# Patient Record
Sex: Male | Born: 1955 | Race: Black or African American | Hispanic: No | Marital: Married | Smoking: Former smoker
Health system: Southern US, Community
[De-identification: ages and names within clinical notes are randomized; demographics above are authoritative.]

## PROBLEM LIST (undated history)

## (undated) DIAGNOSIS — I10 Essential (primary) hypertension: Secondary | ICD-10-CM

## (undated) DIAGNOSIS — R079 Chest pain, unspecified: Secondary | ICD-10-CM

## (undated) DIAGNOSIS — M1990 Unspecified osteoarthritis, unspecified site: Secondary | ICD-10-CM

## (undated) DIAGNOSIS — Z860100 Personal history of colon polyps, unspecified: Secondary | ICD-10-CM

## (undated) DIAGNOSIS — K5909 Other constipation: Secondary | ICD-10-CM

## (undated) DIAGNOSIS — K219 Gastro-esophageal reflux disease without esophagitis: Secondary | ICD-10-CM

## (undated) DIAGNOSIS — E785 Hyperlipidemia, unspecified: Secondary | ICD-10-CM

## (undated) DIAGNOSIS — K279 Peptic ulcer, site unspecified, unspecified as acute or chronic, without hemorrhage or perforation: Secondary | ICD-10-CM

## (undated) DIAGNOSIS — F17200 Nicotine dependence, unspecified, uncomplicated: Secondary | ICD-10-CM

## (undated) DIAGNOSIS — Z6835 Body mass index (BMI) 35.0-35.9, adult: Secondary | ICD-10-CM

## (undated) DIAGNOSIS — M109 Gout, unspecified: Secondary | ICD-10-CM

## (undated) HISTORY — DX: Chest pain, unspecified: R07.9

## (undated) HISTORY — DX: Body mass index (BMI) 35.0-35.9, adult: Z68.35

## (undated) HISTORY — PX: UMBILICAL HERNIA REPAIR: SHX196

## (undated) HISTORY — DX: Essential (primary) hypertension: I10

## (undated) HISTORY — DX: Personal history of colonic polyps: Z86.010

## (undated) HISTORY — DX: Gastro-esophageal reflux disease without esophagitis: K21.9

## (undated) HISTORY — DX: Hyperlipidemia, unspecified: E78.5

## (undated) HISTORY — PX: CERVICAL SPINE SURGERY: SHX589

## (undated) HISTORY — DX: Unspecified osteoarthritis, unspecified site: M19.90

## (undated) HISTORY — PX: ELBOW SURGERY: SHX618

## (undated) HISTORY — DX: Personal history of colon polyps, unspecified: Z86.0100

## (undated) HISTORY — DX: Other constipation: K59.09

## (undated) HISTORY — DX: Nicotine dependence, unspecified, uncomplicated: F17.200

## (undated) HISTORY — DX: Peptic ulcer, site unspecified, unspecified as acute or chronic, without hemorrhage or perforation: K27.9

---

## 2004-12-17 HISTORY — PX: CARDIAC CATHETERIZATION: SHX172

## 2006-02-23 HISTORY — PX: ESOPHAGOGASTRODUODENOSCOPY: SHX1529

## 2011-09-22 DIAGNOSIS — M19029 Primary osteoarthritis, unspecified elbow: Secondary | ICD-10-CM | POA: Insufficient documentation

## 2011-10-06 DIAGNOSIS — I10 Essential (primary) hypertension: Secondary | ICD-10-CM | POA: Insufficient documentation

## 2011-10-06 DIAGNOSIS — J449 Chronic obstructive pulmonary disease, unspecified: Secondary | ICD-10-CM | POA: Insufficient documentation

## 2011-10-06 DIAGNOSIS — Z720 Tobacco use: Secondary | ICD-10-CM | POA: Insufficient documentation

## 2011-10-06 DIAGNOSIS — Z8719 Personal history of other diseases of the digestive system: Secondary | ICD-10-CM | POA: Insufficient documentation

## 2011-10-11 DIAGNOSIS — M24529 Contracture, unspecified elbow: Secondary | ICD-10-CM | POA: Insufficient documentation

## 2013-05-08 HISTORY — PX: COLONOSCOPY: SHX174

## 2014-02-12 ENCOUNTER — Telehealth: Payer: Self-pay | Admitting: *Deleted

## 2014-02-12 NOTE — Telephone Encounter (Signed)
Pt asked for his appt time on 02/13/2014. I informed him the appt is at 0900am with Dr Charlsie Merlesegal.

## 2014-02-13 ENCOUNTER — Encounter: Payer: Self-pay | Admitting: Podiatry

## 2014-02-13 ENCOUNTER — Ambulatory Visit (INDEPENDENT_AMBULATORY_CARE_PROVIDER_SITE_OTHER): Payer: BC Managed Care – PPO | Admitting: Podiatry

## 2014-02-13 ENCOUNTER — Ambulatory Visit (INDEPENDENT_AMBULATORY_CARE_PROVIDER_SITE_OTHER): Payer: BC Managed Care – PPO

## 2014-02-13 VITALS — BP 131/87 | HR 69 | Resp 16

## 2014-02-13 DIAGNOSIS — L600 Ingrowing nail: Secondary | ICD-10-CM

## 2014-02-13 DIAGNOSIS — R52 Pain, unspecified: Secondary | ICD-10-CM

## 2014-02-13 DIAGNOSIS — M2040 Other hammer toe(s) (acquired), unspecified foot: Secondary | ICD-10-CM

## 2014-02-13 DIAGNOSIS — M898X9 Other specified disorders of bone, unspecified site: Secondary | ICD-10-CM

## 2014-02-14 NOTE — Progress Notes (Signed)
Subjective:     Patient ID: Michael Whitaker, male   DOB: 05-08-1956, 58 y.o.   MRN: [id number]  HPI patient presents stating I am having awful problems with my fifth toe like I had before and my big toenail on my left foot has become increasingly thick and painful when pressed and I cannot wear shoes anymore. Admits he should affix this fifth toe last year when we talked about it but he did not have time and he now is desperate for correction   Review of Systems     Objective:   Physical Exam Neurovascular status found to be intact with patient well oriented and found to have good range of motion subtalar and midtarsal joint with good Fill time to the lesser digits with the digits being warm and well perfused. Distal lateral fifth toe left has a very painful keratotic lesion with rotation of the fifth toe noted causing the increased pressure against this area. Thick hallux nail left that is very painful when pressed from a dorsal direction    Assessment:     Chronic foot structural issues with deviation of the fifth toe leading to keratotic lesion formation and pain and nail disease with thickness and discomfort first left    Plan:     H&P and x-ray reviewed. Discussed condition at great length and discussed fixing this versus trimming which was not effective for him. He wants this fixed and would like to get it done as soon as possible due to pain and at this time I allowed him to read a consent form for distal rotation arthroplasty fifth left distal lateral exostectomy fifth left and removal of the hallux nail. I explained all possible complications as listed and the fact that total recovery. We'll take 6 months to one year and he will not be able to return to his traditional steel toe shoe for a minimum of 4 weeks and up to 8 weeks or longer period patient wants surgery and is given all instructions and is scheduled next week for outpatient surgery and is encouraged to call with any other  questions

## 2014-02-18 ENCOUNTER — Encounter: Payer: Self-pay | Admitting: Podiatry

## 2014-02-18 DIAGNOSIS — L600 Ingrowing nail: Secondary | ICD-10-CM

## 2014-02-18 DIAGNOSIS — M898X9 Other specified disorders of bone, unspecified site: Secondary | ICD-10-CM

## 2014-02-18 DIAGNOSIS — M2040 Other hammer toe(s) (acquired), unspecified foot: Secondary | ICD-10-CM

## 2014-02-19 ENCOUNTER — Telehealth: Payer: Self-pay | Admitting: *Deleted

## 2014-02-19 NOTE — Telephone Encounter (Signed)
Per Dr. Charlsie Merlesegal, I called and advised the patient to take the bandage off the toe that he had the toenail procedure.  I told him to gently cleanse the toe with some antibacterial soap on a wash cloth, do not rub, pat.  Then redress with a bandaid and some neosporin or triple antibiotic ointment.  Repeat this daily until return visit.

## 2014-02-21 ENCOUNTER — Telehealth: Payer: Self-pay | Admitting: *Deleted

## 2014-02-21 NOTE — Telephone Encounter (Signed)
Pt states his ankle is swollen on his surgery foot and asked if it was going to stay that way.  I told pt he may have been up on it too much, to be on that surgery foot no more than 5 minutes per hour.  I instructed pt to take off the ace only and elevate the surgery foot 15 minutes then rewrap ace looser.  Pt denies any other concerns.

## 2014-02-24 ENCOUNTER — Ambulatory Visit (INDEPENDENT_AMBULATORY_CARE_PROVIDER_SITE_OTHER): Payer: BC Managed Care – PPO | Admitting: Podiatry

## 2014-02-24 ENCOUNTER — Ambulatory Visit (INDEPENDENT_AMBULATORY_CARE_PROVIDER_SITE_OTHER): Payer: BC Managed Care – PPO

## 2014-02-24 ENCOUNTER — Encounter: Payer: Self-pay | Admitting: Podiatry

## 2014-02-24 VITALS — BP 147/103 | HR 78 | Resp 16

## 2014-02-24 DIAGNOSIS — Z9889 Other specified postprocedural states: Secondary | ICD-10-CM

## 2014-02-24 DIAGNOSIS — B351 Tinea unguium: Secondary | ICD-10-CM

## 2014-02-24 DIAGNOSIS — M2040 Other hammer toe(s) (acquired), unspecified foot: Secondary | ICD-10-CM

## 2014-02-25 NOTE — Progress Notes (Signed)
Subjective:     Patient ID: Michael Whitaker, male   DOB: 09-13-1956, 58 y.o.   MRN: [id number]  HPI patient presents a my foot is feeling fine states that he is walking well with mild discomfort if she's on it too much   Review of Systems     Objective:   Physical Exam Neurovascular status intact with patient well oriented x3 and well healed surgical sites fifth digit left and distal lateral side fifth toe. Nail site were nail was removed is doing well    Assessment:     Patient is responding well and appears to be healing well 6 days after surgery left    Plan:     Reapplied sterile dressing and instructed on continued open toed shoes and reappoint 2 weeks for suture removal earlier if necessary

## 2014-03-10 ENCOUNTER — Encounter: Payer: Self-pay | Admitting: Podiatry

## 2014-03-10 ENCOUNTER — Other Ambulatory Visit: Payer: BC Managed Care – PPO

## 2014-03-10 ENCOUNTER — Ambulatory Visit (INDEPENDENT_AMBULATORY_CARE_PROVIDER_SITE_OTHER): Payer: BC Managed Care – PPO | Admitting: Podiatry

## 2014-03-10 VITALS — BP 147/94 | HR 77 | Resp 16 | Ht 73.0 in | Wt 250.0 lb

## 2014-03-10 DIAGNOSIS — M898X9 Other specified disorders of bone, unspecified site: Secondary | ICD-10-CM

## 2014-03-10 DIAGNOSIS — M2040 Other hammer toe(s) (acquired), unspecified foot: Secondary | ICD-10-CM

## 2014-03-10 NOTE — Progress Notes (Signed)
Subjective:     Patient ID: Michael Whitaker, male   DOB: 11/21/1956, 58 y.o.   MRN: [id number]  HPI patient presents for suture removal left fifth toe stating he's doing well and his nail is healing well   Review of Systems     Objective:   Physical Exam Neurovascular status intact no health history changes noted with well-healing surgical sites fifth and fourth toe left foot and nail left hallux that is crusting over and looks good    Assessment:     Well-healing digital deformities with stitches intact and wound edges coapted well and well-healing nail site left big toe    Plan:     Stitches are removed and advised on gradual return to saw shoe gear in the next week and hopeful return to work in 1 week. Patient will be seen back 4 weeks earlier if necessary

## 2014-03-10 NOTE — Progress Notes (Signed)
Pt presents for POV2 and suture removal.  Pt's left 5th toe and left 1st toe are without redness, swelling or discharge.  I removed the sutures and instructed pt not to shower for 24 to 48 hours after there removal.

## 2014-03-12 NOTE — Progress Notes (Signed)
Dr Charlsie Merlesegal performed - arthroplasty left 5th distal toe                                  - exostectomy left lateral 5th distal toe                                  - removal left big toenail permanently

## 2014-03-14 NOTE — Progress Notes (Signed)
Dr Charlsie Merlesegal ordered - Demerol 50mg  #30 1 or 2 tablets every 4 to 6 hours prn pain, and Phenergan 25mg  #30 1 or 2 tablets with Demerol.

## 2014-04-07 ENCOUNTER — Ambulatory Visit (INDEPENDENT_AMBULATORY_CARE_PROVIDER_SITE_OTHER): Payer: Self-pay | Admitting: Podiatry

## 2014-04-07 ENCOUNTER — Ambulatory Visit (INDEPENDENT_AMBULATORY_CARE_PROVIDER_SITE_OTHER): Payer: BC Managed Care – PPO

## 2014-04-07 ENCOUNTER — Encounter: Payer: Self-pay | Admitting: Podiatry

## 2014-04-07 VITALS — BP 171/92 | HR 77 | Resp 16

## 2014-04-07 DIAGNOSIS — M2040 Other hammer toe(s) (acquired), unspecified foot: Secondary | ICD-10-CM

## 2014-04-07 NOTE — Progress Notes (Signed)
Subjective:     Patient ID: Michael Whitaker, male   DOB: 02-01-1956, 58 y.o.   MRN: [id number]  HPI patient presents stating my toes are doing well and I am now back to working and wearing steel toe shoes   Review of Systems     Objective:   Physical Exam Neurovascular status found to be intact with digits on the left foot healing well with mild edema no erythema or drainage noted and wound edges well coapted    Assessment:     Healing well from foot surgery left    Plan:     Debris the small lesion and allow patient to return to normal activity and reviewed final x-rays. Reappoint as needed

## 2015-01-19 ENCOUNTER — Encounter (HOSPITAL_COMMUNITY): Payer: Self-pay

## 2015-01-19 ENCOUNTER — Ambulatory Visit: Payer: Self-pay | Admitting: Family Medicine

## 2015-01-19 ENCOUNTER — Emergency Department (HOSPITAL_COMMUNITY)
Admission: EM | Admit: 2015-01-19 | Discharge: 2015-01-19 | Disposition: A | Discharge status: Home / Self Care | Payer: BLUE CROSS/BLUE SHIELD | Attending: Emergency Medicine | Admitting: Emergency Medicine

## 2015-01-19 DIAGNOSIS — Y998 Other external cause status: Secondary | ICD-10-CM | POA: Diagnosis not present

## 2015-01-19 DIAGNOSIS — I10 Essential (primary) hypertension: Secondary | ICD-10-CM | POA: Insufficient documentation

## 2015-01-19 DIAGNOSIS — Z8719 Personal history of other diseases of the digestive system: Secondary | ICD-10-CM | POA: Diagnosis not present

## 2015-01-19 DIAGNOSIS — W228XXA Striking against or struck by other objects, initial encounter: Secondary | ICD-10-CM | POA: Insufficient documentation

## 2015-01-19 DIAGNOSIS — S4992XA Unspecified injury of left shoulder and upper arm, initial encounter: Secondary | ICD-10-CM | POA: Diagnosis not present

## 2015-01-19 DIAGNOSIS — R52 Pain, unspecified: Secondary | ICD-10-CM

## 2015-01-19 DIAGNOSIS — Y9389 Activity, other specified: Secondary | ICD-10-CM | POA: Diagnosis not present

## 2015-01-19 DIAGNOSIS — Z79899 Other long term (current) drug therapy: Secondary | ICD-10-CM | POA: Diagnosis not present

## 2015-01-19 DIAGNOSIS — Y9289 Other specified places as the place of occurrence of the external cause: Secondary | ICD-10-CM | POA: Insufficient documentation

## 2015-01-19 DIAGNOSIS — S4991XA Unspecified injury of right shoulder and upper arm, initial encounter: Secondary | ICD-10-CM | POA: Diagnosis not present

## 2015-01-19 DIAGNOSIS — Z720 Tobacco use: Secondary | ICD-10-CM | POA: Diagnosis not present

## 2015-01-19 DIAGNOSIS — S6992XA Unspecified injury of left wrist, hand and finger(s), initial encounter: Secondary | ICD-10-CM | POA: Diagnosis present

## 2015-01-19 MED ORDER — METOPROLOL SUCCINATE ER 100 MG PO TB24: 100.0000 mg | ORAL_TABLET | Freq: Every day | ORAL | Status: AC

## 2015-01-19 MED ORDER — KETOROLAC TROMETHAMINE 30 MG/ML IJ SOLN
30.0000 mg | Freq: Once | INTRAMUSCULAR | Status: AC
  Administered 2015-01-19: 30 mg via INTRAMUSCULAR
  Filled 2015-01-19: qty 1

## 2015-01-19 MED ORDER — TRAMADOL HCL 50 MG PO TABS: 50.0000 mg | ORAL_TABLET | Freq: Four times a day (QID) | ORAL | Status: DC | PRN

## 2015-01-19 MED ORDER — IBUPROFEN 600 MG PO TABS: 600.0000 mg | ORAL_TABLET | Freq: Three times a day (TID) | ORAL | Status: AC

## 2015-01-19 NOTE — ED Provider Notes (Signed)
CSN: [id number]     Arrival date & time 01/19/15  1445 History   First MD Initiated Contact with Patient 01/19/15 1519     Chief Complaint  Patient presents with  . Hand Pain  . Shoulder Pain     (Consider location/radiation/quality/duration/timing/severity/associated sxs/prior Treatment) HPI Patient presents with concern of pain in 3 areas. History concern is pain in his left hand.  Pain has been present for a long time, approximately 5 months, following an injury when he struck a hard object while tightening a bolt. Since that time there has been pain focally in the ulnar aspect of the dorsal left hand with difficulty fully extending his third fourth and fifth digits. Patient has no change in sensation, nor strength. Pain is sore. Pain is improved with OTC medication. Today the patient was scheduled to see a hand specialist.  Appointment was canceled due to weather concerns.  Patient also complains of bilateral shoulder pain.  Pain is symmetric in severity and location, focally in the medial superior aspect of each shoulder, worse with shoulder abduction, pressure. There is no other anterior chest pain, no dyspnea, no lightheadedness, no nausea. Patient is not exertional or pleuritic.  Past Medical History  Diagnosis Date  . Hypertension   . GERD (gastroesophageal reflux disease)    Past Surgical History  Procedure Laterality Date  . Elbow surgery     History reviewed. No pertinent family history. History  Substance Use Topics  . Smoking status: Current Every Day Smoker -- 1.50 packs/day    Types: Cigarettes  . Smokeless tobacco: Not on file  . Alcohol Use: Yes     Comment: weekends mostly    Occupation: Patient works in a [employer]    Review of Systems  Constitutional:       Per HPI, otherwise negative  HENT:       Per HPI, otherwise negative  Respiratory:       Per HPI, otherwise negative  Cardiovascular:       Per HPI, otherwise  negative  Gastrointestinal: Negative for vomiting.  Endocrine:       Negative aside from HPI  Genitourinary:       Neg aside from HPI   Musculoskeletal:       Per HPI, otherwise negative  Skin: Negative for color change and pallor.  Neurological: Negative for syncope.      Allergies  Review of patient's allergies indicates no known allergies.  Home Medications   Prior to Admission medications   Medication Sig Start Date End Date Taking? Authorizing Provider  ibuprofen (ADVIL,MOTRIN) 200 MG tablet Take 600 mg by mouth every 6 (six) hours as needed for mild pain.   Yes Historical Provider, MD  sildenafil (VIAGRA) 50 MG tablet Take 50 mg by mouth daily as needed for erectile dysfunction.   Yes Historical Provider, MD  metoprolol succinate (TOPROL-XL) 100 MG 24 hr tablet Take 100 mg by mouth daily. Take with or immediately following a meal.    Historical Provider, MD   BP 155/87 mmHg  Pulse 85  Temp(Src) 98.2 F (36.8 C) (Oral)  Resp 18  Ht  (1.854 m)  Wt 250 lb (113.399 kg)  BMI 32.99 kg/m2  SpO2 98% Physical Exam  Constitutional: He is oriented to person, place, and time. He appears well-developed. No distress.  HENT:  Head: Normocephalic and atraumatic.  Eyes: Conjunctivae and EOM are normal.  Cardiovascular: Normal rate and regular rhythm.   Pulmonary/Chest: Effort normal.  No stridor. No respiratory distress.  Abdominal: He exhibits no distension.  Musculoskeletal: He exhibits no edema.       Right shoulder: He exhibits tenderness and pain. He exhibits normal range of motion, no bony tenderness, no swelling, no effusion, no crepitus, no deformity, no laceration, no spasm, normal pulse and normal strength.       Left shoulder: He exhibits tenderness and pain. He exhibits normal range of motion, no bony tenderness, no swelling, no effusion, no crepitus, no deformity, no laceration, no spasm, normal pulse and normal strength.       Arms: Neurological: He is alert and  oriented to person, place, and time. He displays no atrophy and no tremor. No cranial nerve deficit or sensory deficit. He exhibits normal muscle tone. He displays no seizure activity.  Skin: Skin is warm and dry.  Psychiatric: He has a normal mood and affect.  Nursing note and vitals reviewed.   ED Course  Procedures (including critical care time) Cardiac monitor has sinus rhythm, 85, no notable changes. Pulse ox between 99% room air normal Patient smokes, and I discussed smoking cessation with him, encouraged him to pursue this. Patient requested refill of his antihypertensives.   MDM  Patient presents with pain in multiple areas.  The duration of pain, as well as his denial of any chest pain, dyspnea, lightheadedness is reassuring for the low suspicion of ongoing coronary ischemia with atypical presentation Similarly, there is no evidence for infection, neurovascular compromise. Patient was started on a course of anti-inflammatories, analgesics, cryotherapy. Patient has follow-up with hand surgery next week, will follow up with orthopedics as well for evaluation of your shoulder pain, consideration of physical therapy.   Michael Munchobert Malva Diesing, MD 01/19/15 1556

## 2015-01-19 NOTE — Discharge Instructions (Signed)
As discussed, your evaluation today has been largely reassuring.  But, it is important that you monitor your condition carefully, and do not hesitate to return to the ED if you develop new, or concerning changes in your condition.  It is very important that you follow-up as scheduled with your hand specialist, and with an orthopedic surgeon for further evaluation of your shoulder and hand pain.  Please be sure to take all medication as directed, and use ice packs at least twice daily for the next week.

## 2015-01-19 NOTE — ED Notes (Signed)
Pt here for upper shoulder and hand pain, worse with movement. Unknown if injury

## 2015-01-27 ENCOUNTER — Ambulatory Visit (INDEPENDENT_AMBULATORY_CARE_PROVIDER_SITE_OTHER): Payer: BLUE CROSS/BLUE SHIELD | Admitting: Family Medicine

## 2015-01-27 ENCOUNTER — Encounter: Payer: Self-pay | Admitting: Family Medicine

## 2015-01-27 ENCOUNTER — Other Ambulatory Visit (INDEPENDENT_AMBULATORY_CARE_PROVIDER_SITE_OTHER): Payer: BLUE CROSS/BLUE SHIELD

## 2015-01-27 VITALS — BP 132/82 | HR 60 | Ht 73.0 in | Wt 252.0 lb

## 2015-01-27 DIAGNOSIS — M79642 Pain in left hand: Secondary | ICD-10-CM

## 2015-01-27 DIAGNOSIS — M6530 Trigger finger, unspecified finger: Secondary | ICD-10-CM | POA: Insufficient documentation

## 2015-01-27 DIAGNOSIS — S62307A Unspecified fracture of fifth metacarpal bone, left hand, initial encounter for closed fracture: Secondary | ICD-10-CM

## 2015-01-27 NOTE — Patient Instructions (Signed)
Good to see you Ice 20 minutes 2 times daily. Usually after activity and before bed. Exercises 3 times a week.  Vitamin D 2000 IU daily Turmeric 500mg  twice daily Try pennsaid twice daily See me again in 3 weeks.

## 2015-01-27 NOTE — Assessment & Plan Note (Signed)
Patient does have some angulation of the fifth metacarpal. I believe the patient does have a fracture. We discussed possibly getting x-rays which patient declined today. Patient does have some bony abnormality on ultrasound today that does correspond with this. Patient was given the choice of a possible ulnar gutter splint which patient also declined with this being 5 months out. Discussed vitamin D supplementation to try to see if we can improve his healing. Patient and will come back and see me again in 3-4 weeks to make sure the patient is improving.

## 2015-01-27 NOTE — Progress Notes (Signed)
  Michael ScaleZach Whitaker D.O. [facility] [address] [place], [place] [address] Phone: [phone] Subjective:      CC: Left hand pain  QQV:[id number]:Subjective Michael Whitaker is a 59 y.o. male coming in with complaint of left hand pain. Patient has had this pain for a long time. States that his been approximately 5 months. Patient states that he did hit his hand against something well he was working. Had significant amount of pain. Patient states since then he has been having trouble with his ring and no finger seems to get locked in a flexed position. Patient states that sometimes he has to lift him with his other hand. In addition to this patient also states that he has severe pain on the outside of his fifth finger when sometimes it hurts. Patient denies any numbness.     Past medical history, social, surgical and family history all reviewed in electronic medical record.   Review of Systems: No headache, visual changes, nausea, vomiting, diarrhea, constipation, dizziness, abdominal pain, skin rash, fevers, chills, night sweats, weight loss, swollen lymph nodes, body aches, joint swelling, muscle aches, chest pain, shortness of breath, mood changes.   Objective Blood pressure 132/82, pulse 60, height 6\' 1"  (1.854 m), weight 252 lb (114.306 kg), SpO2 98 %.  General: No apparent distress alert and oriented x3 mood and affect normal, dressed appropriately.  HEENT: Pupils equal, extraocular movements intact  Respiratory: Patient's speak in full sentences and does not appear short of breath  Cardiovascular: No lower extremity edema, non tender, no erythema  Skin: Warm dry intact with no signs of infection or rash on extremities or on axial skeleton.  Abdomen: Soft nontender  Neuro: Cranial nerves II through XII are intact, neurovascularly intact in all extremities with 2+ DTRs and 2+ pulses.  Lymph: No lymphadenopathy of posterior or anterior cervical chain or axillae bilaterally.  Gait normal  with good balance and coordination.  MSK:  Non tender with full range of motion and good stability and symmetric strength and tone of shoulders, elbows, wrist, hip, knee and ankles bilaterally.  Hand exam of the left hand shows the patient does have a cannulation of the fifth metacarpal. He is approximately 20 of angulation. Patient also cannot close it completely. Patient is very tender just proximal to the [facility] joint.  Addition this patient does have trigger nodules of the A2 pulleys of both the ring and middle finger.  After verbal consent patient was prepped with call swabs and with a 25-gauge 1 inch needle patient was injected with ultrasound guidance with 0.5 mL of 0.5% Marcaine and 0.5 mL of 40 mg/dL Kenalog with separate injections in the to trigger figures. Patient tolerated the procedures very well. Picture saved. Post injection instructions given.   Impression and Recommendations:     This case required medical decision making of moderate complexity.

## 2015-01-27 NOTE — Assessment & Plan Note (Signed)
Patient was given injections today. Patient was given home exercises and did work with formal athletic therapist. We discussed icing, and topical antibiotic was, and home exercises in great detail. Patient will come back and see me again in 3 weeks to make sure the patient is improving.

## 2015-01-27 NOTE — Progress Notes (Signed)
Pre visit review using our clinic review tool, if applicable. No additional management support is needed unless otherwise documented below in the visit note. 

## 2015-02-17 ENCOUNTER — Ambulatory Visit: Payer: BLUE CROSS/BLUE SHIELD | Admitting: Family Medicine

## 2015-02-21 ENCOUNTER — Emergency Department (HOSPITAL_COMMUNITY)
Admission: EM | Admit: 2015-02-21 | Discharge: 2015-02-21 | Disposition: A | Discharge status: Home / Self Care | Payer: BLUE CROSS/BLUE SHIELD | Attending: Emergency Medicine | Admitting: Emergency Medicine

## 2015-02-21 ENCOUNTER — Emergency Department (HOSPITAL_COMMUNITY): Payer: BLUE CROSS/BLUE SHIELD

## 2015-02-21 ENCOUNTER — Encounter (HOSPITAL_COMMUNITY): Payer: Self-pay

## 2015-02-21 DIAGNOSIS — Z8719 Personal history of other diseases of the digestive system: Secondary | ICD-10-CM | POA: Diagnosis not present

## 2015-02-21 DIAGNOSIS — I10 Essential (primary) hypertension: Secondary | ICD-10-CM

## 2015-02-21 DIAGNOSIS — Z720 Tobacco use: Secondary | ICD-10-CM | POA: Diagnosis not present

## 2015-02-21 DIAGNOSIS — Z79899 Other long term (current) drug therapy: Secondary | ICD-10-CM | POA: Insufficient documentation

## 2015-02-21 DIAGNOSIS — R002 Palpitations: Secondary | ICD-10-CM

## 2015-02-21 LAB — CBC
HCT: 44.1 % (ref 39.0–52.0)
Hemoglobin: 14.4 g/dL (ref 13.0–17.0)
MCH: 31.9 pg (ref 26.0–34.0)
MCHC: 32.7 g/dL (ref 30.0–36.0)
MCV: 97.6 fL (ref 78.0–100.0)
Platelets: 216 10*3/uL (ref 150–400)
RBC: 4.52 MIL/uL (ref 4.22–5.81)
RDW: 13.3 % (ref 11.5–15.5)
WBC: 8 10*3/uL (ref 4.0–10.5)

## 2015-02-21 LAB — BASIC METABOLIC PANEL WITH GFR
Anion gap: 6 (ref 5–15)
BUN: 6 mg/dL (ref 6–23)
CO2: 26 mmol/L (ref 19–32)
Calcium: 8.3 mg/dL — ABNORMAL LOW (ref 8.4–10.5)
Chloride: 106 mmol/L (ref 96–112)
Creatinine, Ser: 0.88 mg/dL (ref 0.50–1.35)
GFR calc Af Amer: >90 mL/min
GFR calc non Af Amer: >90 mL/min
Glucose, Bld: 105 mg/dL — ABNORMAL HIGH (ref 70–99)
Potassium: 3.6 mmol/L (ref 3.5–5.1)
Sodium: 138 mmol/L (ref 135–145)

## 2015-02-21 LAB — I-STAT TROPONIN, ED
Troponin i, poc: 0 ng/mL (ref 0.00–0.08)
Troponin i, poc: 0 ng/mL (ref 0.00–0.08)

## 2015-02-21 LAB — D-DIMER, QUANTITATIVE: D-Dimer, Quant: <0.27 ug/mL-FEU (ref 0.00–0.48)

## 2015-02-21 MED ORDER — ACETAMINOPHEN 325 MG PO TABS
650.0000 mg | ORAL_TABLET | Freq: Once | ORAL | Status: AC
  Administered 2015-02-21: 650 mg via ORAL
  Filled 2015-02-21: qty 2

## 2015-02-21 NOTE — ED Notes (Signed)
Pt verbalized understanding of d/c instructions and has no further questions.  

## 2015-02-21 NOTE — Discharge Instructions (Signed)
Return to the emergency room with worsening of symptoms, new symptoms or with symptoms that are concerning , especially chest pain that feels like a pressure, spreads to left arm or jaw, worse with exertion, associated with nausea, vomiting, shortness of breath and/or sweating.  Please call your doctor for a followup appointment within 24-48 hours. When you talk to your doctor please let them know that you were seen in the emergency department and have them acquire all of your records so that they can discuss the findings with you and formulate a treatment plan to fully care for your new and ongoing problems. If you do not have a primary doctor call the wellness center above to establish care. Call the cardiologist group above to establish care and follow up in 2 days. Read below information and follow recommendations. Palpitations A palpitation is the feeling that your heartbeat is irregular or is faster than normal. It may feel like your heart is fluttering or skipping a beat. Palpitations are usually not a serious problem. However, in some cases, you may need further medical evaluation. CAUSES  Palpitations can be caused by:  Smoking.  Caffeine or other stimulants, such as diet pills or energy drinks.  Alcohol.  Stress and anxiety.  Strenuous physical activity.  Fatigue.  Certain medicines.  Heart disease, especially if you have a history of irregular heart rhythms (arrhythmias), such as atrial fibrillation, atrial flutter, or supraventricular tachycardia.  An improperly working pacemaker or defibrillator. DIAGNOSIS  To find the cause of your palpitations, your health care provider will take your medical history and perform a physical exam. Your health care provider may also have you take a test called an ambulatory electrocardiogram (ECG). An ECG records your heartbeat patterns over a 24-hour period. You may also have other tests, such as:  Transthoracic echocardiogram (TTE). During  echocardiography, sound waves are used to evaluate how blood flows through your heart.  Transesophageal echocardiogram (TEE).  Cardiac monitoring. This allows your health care provider to monitor your heart rate and rhythm in real time.  Holter monitor. This is a portable device that records your heartbeat and can help diagnose heart arrhythmias. It allows your health care provider to track your heart activity for several days, if needed.  Stress tests by exercise or by giving medicine that makes the heart beat faster. TREATMENT  Treatment of palpitations depends on the cause of your symptoms and can vary greatly. Most cases of palpitations do not require any treatment other than time, relaxation, and monitoring your symptoms. Other causes, such as atrial fibrillation, atrial flutter, or supraventricular tachycardia, usually require further treatment. HOME CARE INSTRUCTIONS   Avoid:  Caffeinated coffee, tea, soft drinks, diet pills, and energy drinks.  Chocolate.  Alcohol.  Stop smoking if you smoke.  Reduce your stress and anxiety. Things that can help you relax include:  A method of controlling things in your body, such as your heartbeats, with your mind (biofeedback).  Yoga.  Meditation.  Physical activity such as swimming, jogging, or walking.  Get plenty of rest and sleep. SEEK MEDICAL CARE IF:   You continue to have a fast or irregular heartbeat beyond 24 hours.  Your palpitations occur more often. SEEK IMMEDIATE MEDICAL CARE IF:  You have chest pain or shortness of breath.  You have a severe headache.  You feel dizzy or you faint. MAKE SURE YOU:  Understand these instructions.  Will watch your condition.  Will get help right away if you are not doing well  or get worse. Document Released: 11/18/2000 Document Revised: 11/26/2013 Document Reviewed: 01/20/2012 [facility] Patient Information 2015 [place], [place]. This information is not intended to replace advice  given to you by your health care provider. Make sure you discuss any questions you have with your health care provider.

## 2015-02-21 NOTE — ED Notes (Signed)
Pt to remain 1 more hours since developing rib cage pain to repeat another troponin per PA Ranell Patrickreech

## 2015-02-21 NOTE — ED Provider Notes (Signed)
CSN: [id number]     Arrival date & time 02/21/15  1908 History   First MD Initiated Contact with Patient 02/21/15 1915     Chief Complaint  Patient presents with  . Hypertension     (Consider location/radiation/quality/duration/timing/severity/associated sxs/prior Treatment) HPI  Michael Whitaker is a 59 y.o. male with PMH of hypertension, GERD presenting via [facility] with complaint of high bp as well as fluttering in his chest. Initial BP 200/120 and patient was given 3 nitroglycerin tablets and aspirin which resolved the fluttering and subsequent BP was 147/108. EMS reported monitor showed occasional PACs. Patient stated he felt like his heart was skipping a beat and did have initial shortness of breath. No nausea, vomiting, diaphoresis. Patient denies history of diabetes. He is a smoker. He reports over 5 years ago having a unremarkable stress test and catheterization. He states he has not seen a cardiologist since. Has not seen his primary care provider in over a year. He denies headaches, visual changes, hematuria.    Past Medical History  Diagnosis Date  . Hypertension   . GERD (gastroesophageal reflux disease)    Past Surgical History  Procedure Laterality Date  . Elbow surgery     History reviewed. No pertinent family history. History  Substance Use Topics  . Smoking status: Current Every Day Smoker -- 1.50 packs/day    Types: Cigarettes  . Smokeless tobacco: Not on file  . Alcohol Use: Yes     Comment: weekends mostly    Review of Systems 10 Systems reviewed and are negative for acute change except as noted in the HPI.    Allergies  Review of patient's allergies indicates no known allergies.  Home Medications   Prior to Admission medications   Medication Sig Start Date End Date Taking? Authorizing Provider  metoprolol succinate (TOPROL-XL) 100 MG 24 hr tablet Take 1 tablet (100 mg total) by mouth daily. Take with or immediately following a meal. 01/19/15  Yes  Gerhard Munchobert Lockwood, MD  sildenafil (VIAGRA) 50 MG tablet Take 50 mg by mouth daily as needed for erectile dysfunction.   Yes Historical Provider, MD  traMADol (ULTRAM) 50 MG tablet Take 1 tablet (50 mg total) by mouth every 6 (six) hours as needed for severe pain. Patient not taking: Reported on 02/21/2015 01/19/15   Gerhard Munchobert Lockwood, MD   BP 136/84 mmHg  Pulse 57  Temp(Src) 98.1 F (36.7 C) (Oral)  Resp 17  Ht 6\' 1"  (1.854 m)  Wt 255 lb (115.667 kg)  BMI 33.65 kg/m2  SpO2 100% Physical Exam  Constitutional: He appears well-developed and well-nourished. No distress.  HENT:  Head: Normocephalic and atraumatic.  Eyes: Conjunctivae and EOM are normal. Right eye exhibits no discharge. Left eye exhibits no discharge.  Neck: No JVD present.  Cardiovascular: Normal rate and regular rhythm.   No leg swelling or tenderness. Negative Homan's sign.  Pulmonary/Chest: Effort normal and breath sounds normal. No respiratory distress. He has no wheezes.  Abdominal: Soft. Bowel sounds are normal. He exhibits no distension. There is no tenderness.  Neurological: He is alert. He exhibits normal muscle tone. Coordination normal.  Skin: Skin is warm and dry. He is not diaphoretic.  Nursing note and vitals reviewed.   ED Course  Procedures (including critical care time) Labs Review Labs Reviewed  BASIC METABOLIC PANEL - Abnormal; Notable for the following:    Glucose, Bld 105 (*)    Calcium 8.3 (*)    All other components within normal limits  CBC  D-DIMER, QUANTITATIVE  Rosezena Sensor, ED    Imaging Review Dg Chest Port 1 View  02/21/2015   CLINICAL DATA:  High blood pressure.  Smoker  EXAM: PORTABLE CHEST - 1 VIEW  COMPARISON:  Radiograph 12/26/2007  FINDINGS: Normal mediastinum and cardiac silhouette. Normal pulmonary vasculature. No evidence of effusion, infiltrate, or pneumothorax. No acute bony abnormality.  IMPRESSION: No acute cardiopulmonary process.   Electronically Signed   By: Genevive Bi M.D.   On: 02/21/2015 20:15     EKG Interpretation   Date/Time:  Saturday February 21 2015 19:15:01 EDT Ventricular Rate:  74 PR Interval:  130 QRS Duration: 101 QT Interval:  410 QTC Calculation: 455 R Axis:   65 Text Interpretation:  Sinus rhythm Probable left atrial enlargement Left  ventricular hypertrophy No old tracing to compare Confirmed by KNAPP   MD-J, JON ([id number]) on 02/21/2015 7:20:15 PM      MDM   Final diagnoses:  Essential hypertension  Fluttering sensation of heart   Area of dyslipidemia presenting with burning in his chest sensation of skipping a beat. He also has high blood pressure which has been normal in the ED. No JVD or peripheral edema. EKG without evidence of ischemia and initial troponin negative. Chest x-ray negative. Low risk wells with negative D-dimer. I doubt ACS or PE. No evidence of hypertensive urgency or emergency.   10:10 PM Pt developed bilateral rib cage pain worse with movement. Patient stated felt like gas pain is better with passing gas and defecation. Will check delta troponin.  10:53 PM Delta troponin negative. Pt pain free. I doubt ACS. Pt to follow up with PCP and cardiology in 2 days. Discussed return precautions with patient. Discussed all results and patient verbalizes understanding and agrees with plan.  Case has been discussed with Dr. Lynelle Doctor who agrees with the above plan and to discharge.      Oswaldo Conroy, PA-C 02/21/15 2338  Linwood Dibbles, MD 02/22/15 1247

## 2015-02-21 NOTE — ED Notes (Signed)
[facility] hypertension and "fluttering" in his chest.  EMS reports that the initial BP was 200/120 and gave 3 nitroglycerin tablets en route.  18g IV inserted in left AC by EMS.  Blood pressure reading was 147/108 upon arrival to the ED.  97% O2 on room air.  EMS reports that the heart monitor showed occasional PACs but the 12 lead EKG looked normal.

## 2015-02-23 ENCOUNTER — Emergency Department (HOSPITAL_COMMUNITY)
Admission: EM | Admit: 2015-02-23 | Discharge: 2015-02-23 | Discharge status: Against Medical Advice | Payer: BLUE CROSS/BLUE SHIELD | Attending: Emergency Medicine | Admitting: Emergency Medicine

## 2015-02-23 ENCOUNTER — Encounter (HOSPITAL_COMMUNITY): Payer: Self-pay | Admitting: *Deleted

## 2015-02-23 ENCOUNTER — Emergency Department (HOSPITAL_COMMUNITY): Payer: BLUE CROSS/BLUE SHIELD

## 2015-02-23 DIAGNOSIS — R1084 Generalized abdominal pain: Secondary | ICD-10-CM | POA: Diagnosis present

## 2015-02-23 DIAGNOSIS — K5900 Constipation, unspecified: Secondary | ICD-10-CM | POA: Insufficient documentation

## 2015-02-23 LAB — CBC WITH DIFFERENTIAL/PLATELET
Basophils Absolute: 0 10*3/uL (ref 0.0–0.1)
Basophils Relative: 0 % (ref 0–1)
EOS PCT: 2 % (ref 0–5)
Eosinophils Absolute: 0.2 10*3/uL (ref 0.0–0.7)
HEMATOCRIT: 47.4 % (ref 39.0–52.0)
Hemoglobin: 15.8 g/dL (ref 13.0–17.0)
Lymphocytes Relative: 33 % (ref 12–46)
Lymphs Abs: 3.3 10*3/uL (ref 0.7–4.0)
MCH: 31.9 pg (ref 26.0–34.0)
MCHC: 33.3 g/dL (ref 30.0–36.0)
MCV: 95.6 fL (ref 78.0–100.0)
MONO ABS: 0.6 10*3/uL (ref 0.1–1.0)
Monocytes Relative: 6 % (ref 3–12)
NEUTROS ABS: 5.8 10*3/uL (ref 1.7–7.7)
Neutrophils Relative %: 59 % (ref 43–77)
Platelets: 249 10*3/uL (ref 150–400)
RBC: 4.96 MIL/uL (ref 4.22–5.81)
RDW: 12.9 % (ref 11.5–15.5)
WBC: 9.9 10*3/uL (ref 4.0–10.5)

## 2015-02-23 LAB — COMPREHENSIVE METABOLIC PANEL
ALBUMIN: 3.6 g/dL (ref 3.5–5.2)
ALT: 24 U/L (ref 0–53)
AST: 28 U/L (ref 0–37)
Alkaline Phosphatase: 67 U/L (ref 39–117)
Anion gap: 6 (ref 5–15)
BUN: 15 mg/dL (ref 6–23)
CHLORIDE: 106 mmol/L (ref 96–112)
CO2: 25 mmol/L (ref 19–32)
CREATININE: 0.88 mg/dL (ref 0.50–1.35)
Calcium: 8.8 mg/dL (ref 8.4–10.5)
GFR calc Af Amer: >90 mL/min (ref >90)
GFR calc non Af Amer: >90 mL/min (ref >90)
Glucose, Bld: 104 mg/dL — ABNORMAL HIGH (ref 70–99)
Potassium: 4.3 mmol/L (ref 3.5–5.1)
SODIUM: 137 mmol/L (ref 135–145)
Total Bilirubin: 0.9 mg/dL (ref 0.3–1.2)
Total Protein: 7.1 g/dL (ref 6.0–8.3)

## 2015-02-23 NOTE — ED Notes (Addendum)
Pt in c/o constipation, LBM was three days ago, had a small BM this morning without relief, reporting generalized abdominal cramping, denies n/v

## 2015-02-23 NOTE — ED Notes (Signed)
Patient came to nurse first and stated he was having abd cramping when he arrived.  Stated he used the BR and had a BM.  States he is feeling better and does not need to stay.  Encouraged him to stay but he stated he would be back if he got worse.  Stated he has been taking his BP meds and is feeling better.

## 2015-03-26 ENCOUNTER — Encounter: Payer: Self-pay | Admitting: Cardiology

## 2015-03-26 ENCOUNTER — Ambulatory Visit (INDEPENDENT_AMBULATORY_CARE_PROVIDER_SITE_OTHER): Payer: BLUE CROSS/BLUE SHIELD | Admitting: Cardiology

## 2015-03-26 VITALS — BP 118/76 | HR 72 | Ht 73.0 in | Wt 256.2 lb

## 2015-03-26 DIAGNOSIS — R011 Cardiac murmur, unspecified: Secondary | ICD-10-CM | POA: Diagnosis not present

## 2015-03-26 DIAGNOSIS — Z720 Tobacco use: Secondary | ICD-10-CM | POA: Diagnosis not present

## 2015-03-26 DIAGNOSIS — I10 Essential (primary) hypertension: Secondary | ICD-10-CM | POA: Insufficient documentation

## 2015-03-26 NOTE — Patient Instructions (Signed)
Your physician has requested that you have an echocardiogram. Echocardiography is a painless test that uses sound waves to create images of your heart. It provides your doctor with information about the size and shape of your heart and how well your heart's chambers and valves are working. This procedure takes approximately one hour. There are no restrictions for this procedure.  Your physician recommends that you schedule a follow-up appointment As needed.

## 2015-03-26 NOTE — Progress Notes (Signed)
Cardiology Office Note   Date:  03/26/2015   ID:  Michael Whitaker, DOB [birth year], MRN [id number]  PCP:  Michael Floor., MD  Cardiologist:   Michael Rotunda, MD   Chief Complaint  Patient presents with  . Hospitalization Follow-up    for hypertension      History of Present Illness: Michael Whitaker is a 59 y.o. male who presents for evaluation of hypertension palpitations. He was in the emergency room in late March and I reviewed these records. He did not been taking any blood pressure medicines. He been having some palpitations. However, no significant abnormalities noted during that time. He has since been seen his primary care doctor taking his blood pressure medicines. He actually says he feels good. He's not having any palpitations in his blood pressure is controlled. 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 He's not active at all. He doesn't exercise at home. With his usual activities however he denies any cardiovascular symptoms. The patient denies any new symptoms such as chest discomfort, neck or arm discomfort. There has been no new shortness of breath, PND or orthopnea. There have been no reported palpitations, presyncope or syncope.  Of note the patient reports he did have a cardiac cath and an echocardiogram some years ago in [place]. I don't have these records.  These were normal.    Past Medical History  Diagnosis Date  . Hypertension   . GERD (gastroesophageal reflux disease)     Past Surgical History  Procedure Laterality Date  . Elbow surgery       Current Outpatient Prescriptions  Medication Sig Dispense Refill  . amoxicillin-clavulanate (AUGMENTIN) 875-125 MG per tablet Take 1 tablet by mouth 2 (two) times daily.    Marland Kitchen DEXILANT 60 MG capsule Take 1 tablet by mouth daily.    . metoprolol succinate (TOPROL-XL) 100 MG 24 hr tablet Take 1 tablet (100 mg total) by mouth daily. Take with or immediately following a meal. 30 tablet 0  . sildenafil (VIAGRA) 50 MG  tablet Take 50 mg by mouth daily as needed for erectile dysfunction.    . simvastatin (ZOCOR) 10 MG tablet Take 1 tablet by mouth daily.     No current facility-administered medications for this visit.    Allergies:   Review of patient's allergies indicates no known allergies.    Social History:  The patient  reports that he has been smoking Cigarettes.  He has been smoking about 1.50 packs per day. He does not have any smokeless tobacco history on file. He reports that he drinks alcohol. He reports that he does not use illicit drugs.   Family History:  The patient's family history is not on file.    ROS:  Please see the history of present illness.   Otherwise, review of systems are positive for none.   All other systems are reviewed and negative.    PHYSICAL EXAM: VS:  BP 118/76 mmHg  Pulse 72  Ht  (1.854 m)  Wt 256 lb 3.2 oz (116.212 kg)  BMI 33.81 kg/m2 , BMI Body mass index is 33.81 kg/(m^2). GENERAL:  Well appearing HEENT:  Pupils equal round and reactive, fundi not visualized, oral mucosa unremarkable NECK:  No jugular venous distention, waveform within normal limits, carotid upstroke brisk and symmetric, no bruits, no thyromegaly LYMPHATICS:  No cervical, inguinal adenopathy LUNGS:  Clear to auscultation bilaterally BACK:  No CVA tenderness CHEST:  Unremarkable HEART:  PMI not displaced or sustained,S1  and S2 within normal limits, no S3, no S4, no clicks, no rubs, 2 out of 6 apical systolic murmur heard best at the right upper sternal border and early peaking, no diastolic murmurs ABD:  Flat, positive bowel sounds normal in frequency in pitch, no bruits, no rebound, no guarding, no midline pulsatile mass, no hepatomegaly, no splenomegaly EXT:  2 plus pulses throughout, no edema, no cyanosis no clubbing SKIN:  No rashes no nodules NEURO:  Cranial nerves II through XII grossly intact, motor grossly intact throughout PSYCH:  Cognitively intact, oriented to person place and  time    EKG:  EKG is ordered today. The ekg ordered today demonstrates sinus rhythm, rate 72, axis within normal limits, intervals within normal limits, left ventricular ectopy by voltage criteria   Recent Labs: 02/23/2015: ALT 24; BUN 15; Creatinine 0.88; Hemoglobin 15.8; Platelets 249; Potassium 4.3; Sodium 137    Lipid Panel No results found for: CHOL, TRIG, HDL, CHOLHDL, VLDL, LDLCALC, LDLDIRECT    Wt Readings from Last 3 Encounters:  03/26/15 256 lb 3.2 oz (116.212 kg)  02/21/15 255 lb (115.667 kg)  01/27/15 252 lb (114.306 kg)      Other studies Reviewed: Additional studies/ records that were reviewed today include: ER records. Review of the above records demonstrates:  Please see elsewhere in the note.     ASSESSMENT AND PLAN:  HTN:  His blood pressure is now controlled. He will continue the meds as listed.  PALPITATIONS:  The patient has had no further palpitations. No change in therapy is indicated.  TOBACCO:  We discussed this at length. He will try to quit cold [place].  OVERWEIGHT:  We had a long discussion about diet and in particular exercise. He doesn't exercise at all.  MURMUR:  I suspect some aortic sclerosis and we'll check an echocardiogram.   Current medicines are reviewed at length with the patient today.  The patient does not have concerns regarding medicines.  The following changes have been made:  no change  Labs/ tests ordered today include: Echo    Disposition:   FU with me as needed    Signed, Michael RotundaJames Reynald Woods, MD  03/26/2015 8:50 AM    [facility]

## 2015-04-09 ENCOUNTER — Ambulatory Visit (HOSPITAL_COMMUNITY)
Admission: RE | Admit: 2015-04-09 | Discharge: 2015-04-09 | Disposition: A | Discharge status: Home / Self Care | Payer: BLUE CROSS/BLUE SHIELD | Source: Ambulatory Visit | Attending: Cardiology | Admitting: Cardiology

## 2015-04-09 DIAGNOSIS — R011 Cardiac murmur, unspecified: Secondary | ICD-10-CM | POA: Diagnosis not present

## 2015-04-09 DIAGNOSIS — I10 Essential (primary) hypertension: Secondary | ICD-10-CM | POA: Insufficient documentation

## 2015-08-03 ENCOUNTER — Ambulatory Visit: Payer: BLUE CROSS/BLUE SHIELD | Admitting: Family Medicine

## 2015-08-18 ENCOUNTER — Encounter: Payer: Self-pay | Admitting: Family Medicine

## 2015-08-18 ENCOUNTER — Other Ambulatory Visit (INDEPENDENT_AMBULATORY_CARE_PROVIDER_SITE_OTHER): Payer: BLUE CROSS/BLUE SHIELD

## 2015-08-18 ENCOUNTER — Ambulatory Visit (INDEPENDENT_AMBULATORY_CARE_PROVIDER_SITE_OTHER): Payer: BLUE CROSS/BLUE SHIELD | Admitting: Family Medicine

## 2015-08-18 VITALS — BP 122/84 | HR 72 | Ht 73.0 in | Wt 258.0 lb

## 2015-08-18 DIAGNOSIS — M6530 Trigger finger, unspecified finger: Secondary | ICD-10-CM

## 2015-08-18 NOTE — Progress Notes (Signed)
  [facility] [address] [place], [place] [address] Phone: [phone] Subjective:      CC: Left hand pain follow up  BJY:Michael Whitaker is a 59 y.o. male coming in with complaint of left hand pain. Patient was seen previously 5 months ago and was given an injection of the trigger finger. Patient states he was doing significantly better until the last several weeks. Has noticed some mild increase in pain as well as stiffness in the finger. Starting to have triggering again. Starting affects his daily activities as well as his work.     Past medical history, social, surgical and family history all reviewed in electronic medical record.   Review of Systems: No headache, visual changes, nausea, vomiting, diarrhea, constipation, dizziness, abdominal pain, skin rash, fevers, chills, night sweats, weight loss, swollen lymph nodes, body aches, joint swelling, muscle aches, chest pain, shortness of breath, mood changes.   Objective Blood pressure 122/84, pulse 72, height  (1.854 m), weight 258 lb (117.028 kg), SpO2 97 %.  General: No apparent distress alert and oriented x3 mood and affect normal, dressed appropriately.  HEENT: Pupils equal, extraocular movements intact  Respiratory: Patient's speak in full sentences and does not appear short of breath  Cardiovascular: No lower extremity edema, non tender, no erythema  Skin: Warm dry intact with no signs of infection or rash on extremities or on axial skeleton.  Abdomen: Soft nontender  Neuro: Cranial nerves II through XII are intact, neurovascularly intact in all extremities with 2+ DTRs and 2+ pulses.  Lymph: No lymphadenopathy of posterior or anterior cervical chain or axillae bilaterally.  Gait normal with good balance and coordination.  MSK:  Non tender with full range of motion and good stability and symmetric strength and tone of shoulders, elbows, wrist, hip, knee and ankles bilaterally.    Hand exam of the left hand shows the patient does have a angulation of the fifth metacarpal. He is approximately 20 of angulation. Does have full range of motion which is better than previous. Patient is very tender just proximal to the [facility] joint. Pain is over the fourth finger at the A2 pulley and does have trigger   After verbal consent patient was prepped with alcohol swabs and with a 25-gauge 1 inch needle patient was injected with ultrasound guidance with 0.5 mL of 0.5% Marcaine and 0.5 mL of 40 mg/dL Kenalog and tendon sheath. Patient tolerated the procedures very well. Picture saved. Post injection instructions given.   Impression and Recommendations:     This case required medical decision making of moderate complexity.

## 2015-08-18 NOTE — Patient Instructions (Addendum)
Good to see you Ice is your friend Wear the splint nightly for 2 weeks See me when you need me.

## 2015-08-18 NOTE — Progress Notes (Signed)
Pre visit review using our clinic review tool, if applicable. No additional management support is needed unless otherwise documented below in the visit note. 

## 2015-08-18 NOTE — Assessment & Plan Note (Signed)
Patient had another trigger finger injection today. Patient tolerated the procedure well. We discussed icing regimen and home exercises. We discussed splinting nightly for the next 2 weeks. Patient showed proper area where to put the splint. Patient will come back and see me again as needed and we can repeat injections every 3-4 months safely.

## 2016-04-05 DIAGNOSIS — S29019A Strain of muscle and tendon of unspecified wall of thorax, initial encounter: Secondary | ICD-10-CM | POA: Diagnosis not present

## 2016-04-05 DIAGNOSIS — Z79899 Other long term (current) drug therapy: Secondary | ICD-10-CM | POA: Diagnosis not present

## 2016-04-05 DIAGNOSIS — Z6831 Body mass index (BMI) 31.0-31.9, adult: Secondary | ICD-10-CM | POA: Diagnosis not present

## 2016-04-26 DIAGNOSIS — M4712 Other spondylosis with myelopathy, cervical region: Secondary | ICD-10-CM | POA: Diagnosis not present

## 2016-05-03 DIAGNOSIS — M4712 Other spondylosis with myelopathy, cervical region: Secondary | ICD-10-CM | POA: Diagnosis not present

## 2016-05-03 DIAGNOSIS — M4802 Spinal stenosis, cervical region: Secondary | ICD-10-CM | POA: Diagnosis not present

## 2016-05-06 DIAGNOSIS — M5011 Cervical disc disorder with radiculopathy,  high cervical region: Secondary | ICD-10-CM | POA: Diagnosis not present

## 2016-05-06 DIAGNOSIS — M50323 Other cervical disc degeneration at C6-C7 level: Secondary | ICD-10-CM | POA: Diagnosis not present

## 2016-05-06 DIAGNOSIS — M5031 Other cervical disc degeneration,  high cervical region: Secondary | ICD-10-CM | POA: Diagnosis not present

## 2016-05-06 DIAGNOSIS — M50322 Other cervical disc degeneration at C5-C6 level: Secondary | ICD-10-CM | POA: Diagnosis not present

## 2016-05-06 DIAGNOSIS — M50321 Other cervical disc degeneration at C4-C5 level: Secondary | ICD-10-CM | POA: Diagnosis not present

## 2016-05-20 DIAGNOSIS — M4712 Other spondylosis with myelopathy, cervical region: Secondary | ICD-10-CM | POA: Diagnosis not present

## 2016-05-26 DIAGNOSIS — E559 Vitamin D deficiency, unspecified: Secondary | ICD-10-CM | POA: Diagnosis not present

## 2016-05-26 DIAGNOSIS — Z01810 Encounter for preprocedural cardiovascular examination: Secondary | ICD-10-CM | POA: Diagnosis not present

## 2016-05-26 DIAGNOSIS — Z01812 Encounter for preprocedural laboratory examination: Secondary | ICD-10-CM | POA: Diagnosis not present

## 2016-05-26 DIAGNOSIS — Z79899 Other long term (current) drug therapy: Secondary | ICD-10-CM | POA: Diagnosis not present

## 2016-05-26 DIAGNOSIS — Z01818 Encounter for other preprocedural examination: Secondary | ICD-10-CM | POA: Diagnosis not present

## 2016-05-26 DIAGNOSIS — M79603 Pain in arm, unspecified: Secondary | ICD-10-CM | POA: Diagnosis not present

## 2016-05-27 DIAGNOSIS — I10 Essential (primary) hypertension: Secondary | ICD-10-CM | POA: Diagnosis not present

## 2016-05-27 DIAGNOSIS — K219 Gastro-esophageal reflux disease without esophagitis: Secondary | ICD-10-CM | POA: Diagnosis not present

## 2016-05-27 DIAGNOSIS — Z01810 Encounter for preprocedural cardiovascular examination: Secondary | ICD-10-CM | POA: Diagnosis not present

## 2016-05-27 DIAGNOSIS — E785 Hyperlipidemia, unspecified: Secondary | ICD-10-CM | POA: Diagnosis not present

## 2016-06-09 DIAGNOSIS — K219 Gastro-esophageal reflux disease without esophagitis: Secondary | ICD-10-CM | POA: Diagnosis not present

## 2016-06-09 DIAGNOSIS — M4802 Spinal stenosis, cervical region: Secondary | ICD-10-CM | POA: Diagnosis not present

## 2016-06-09 DIAGNOSIS — M4712 Other spondylosis with myelopathy, cervical region: Secondary | ICD-10-CM | POA: Diagnosis not present

## 2016-06-09 DIAGNOSIS — M4322 Fusion of spine, cervical region: Secondary | ICD-10-CM | POA: Diagnosis not present

## 2016-06-09 DIAGNOSIS — R079 Chest pain, unspecified: Secondary | ICD-10-CM | POA: Diagnosis not present

## 2016-06-09 DIAGNOSIS — I10 Essential (primary) hypertension: Secondary | ICD-10-CM | POA: Diagnosis not present

## 2016-06-09 DIAGNOSIS — F17210 Nicotine dependence, cigarettes, uncomplicated: Secondary | ICD-10-CM | POA: Diagnosis not present

## 2016-06-09 DIAGNOSIS — Z79899 Other long term (current) drug therapy: Secondary | ICD-10-CM | POA: Diagnosis not present

## 2016-06-09 DIAGNOSIS — E7800 Pure hypercholesterolemia, unspecified: Secondary | ICD-10-CM | POA: Diagnosis not present

## 2016-06-09 DIAGNOSIS — M2420 Disorder of ligament, unspecified site: Secondary | ICD-10-CM | POA: Diagnosis not present

## 2016-06-10 DIAGNOSIS — R079 Chest pain, unspecified: Secondary | ICD-10-CM | POA: Diagnosis not present

## 2016-06-10 DIAGNOSIS — M4322 Fusion of spine, cervical region: Secondary | ICD-10-CM | POA: Diagnosis not present

## 2016-06-10 DIAGNOSIS — K219 Gastro-esophageal reflux disease without esophagitis: Secondary | ICD-10-CM | POA: Diagnosis not present

## 2016-06-10 DIAGNOSIS — I10 Essential (primary) hypertension: Secondary | ICD-10-CM | POA: Diagnosis not present

## 2016-06-10 DIAGNOSIS — F17210 Nicotine dependence, cigarettes, uncomplicated: Secondary | ICD-10-CM

## 2016-06-17 DIAGNOSIS — Z4801 Encounter for change or removal of surgical wound dressing: Secondary | ICD-10-CM | POA: Diagnosis not present

## 2016-06-17 DIAGNOSIS — S1190XA Unspecified open wound of unspecified part of neck, initial encounter: Secondary | ICD-10-CM | POA: Diagnosis not present

## 2016-06-27 DIAGNOSIS — E785 Hyperlipidemia, unspecified: Secondary | ICD-10-CM | POA: Diagnosis not present

## 2016-06-27 DIAGNOSIS — Z125 Encounter for screening for malignant neoplasm of prostate: Secondary | ICD-10-CM | POA: Diagnosis not present

## 2016-06-27 DIAGNOSIS — I10 Essential (primary) hypertension: Secondary | ICD-10-CM | POA: Diagnosis not present

## 2016-06-27 DIAGNOSIS — Z6831 Body mass index (BMI) 31.0-31.9, adult: Secondary | ICD-10-CM | POA: Diagnosis not present

## 2016-06-27 DIAGNOSIS — K219 Gastro-esophageal reflux disease without esophagitis: Secondary | ICD-10-CM | POA: Diagnosis not present

## 2016-07-06 DIAGNOSIS — M4712 Other spondylosis with myelopathy, cervical region: Secondary | ICD-10-CM | POA: Diagnosis not present

## 2016-07-14 ENCOUNTER — Emergency Department (HOSPITAL_COMMUNITY)
Admission: EM | Admit: 2016-07-14 | Discharge: 2016-07-14 | Disposition: A | Discharge status: Home / Self Care | Payer: BLUE CROSS/BLUE SHIELD | Attending: Emergency Medicine | Admitting: Emergency Medicine

## 2016-07-14 ENCOUNTER — Encounter (HOSPITAL_COMMUNITY): Payer: Self-pay

## 2016-07-14 ENCOUNTER — Emergency Department (HOSPITAL_COMMUNITY): Payer: BLUE CROSS/BLUE SHIELD

## 2016-07-14 DIAGNOSIS — F17210 Nicotine dependence, cigarettes, uncomplicated: Secondary | ICD-10-CM | POA: Diagnosis not present

## 2016-07-14 DIAGNOSIS — R110 Nausea: Secondary | ICD-10-CM | POA: Diagnosis not present

## 2016-07-14 DIAGNOSIS — R1013 Epigastric pain: Secondary | ICD-10-CM

## 2016-07-14 DIAGNOSIS — I10 Essential (primary) hypertension: Secondary | ICD-10-CM | POA: Insufficient documentation

## 2016-07-14 DIAGNOSIS — R079 Chest pain, unspecified: Secondary | ICD-10-CM | POA: Diagnosis not present

## 2016-07-14 LAB — I-STAT CHEM 8, ED
BUN: 8 mg/dL (ref 6–20)
CHLORIDE: 102 mmol/L (ref 101–111)
Calcium, Ion: 1.1 mmol/L — ABNORMAL LOW (ref 1.13–1.30)
Creatinine, Ser: 0.8 mg/dL (ref 0.61–1.24)
GLUCOSE: 102 mg/dL — AB (ref 65–99)
HCT: 42 % (ref 39.0–52.0)
HEMOGLOBIN: 14.3 g/dL (ref 13.0–17.0)
POTASSIUM: 3.9 mmol/L (ref 3.5–5.1)
Sodium: 141 mmol/L (ref 135–145)
TCO2: 26 mmol/L (ref 0–100)

## 2016-07-14 LAB — I-STAT TROPONIN, ED: Troponin i, poc: 0 ng/mL (ref 0.00–0.08)

## 2016-07-14 MED ORDER — OMEPRAZOLE 20 MG PO CPDR: 20.0000 mg | DELAYED_RELEASE_CAPSULE | Freq: Every day | ORAL | 1 refills | Status: DC

## 2016-07-14 MED ORDER — GI COCKTAIL ~~LOC~~
30.0000 mL | Freq: Once | ORAL | Status: DC
Start: 2016-07-14 — End: 2016-07-14

## 2016-07-14 NOTE — Discharge Instructions (Signed)

## 2016-07-14 NOTE — ED Triage Notes (Signed)
Pt complaint of reflux and feeling like he needs to burp but cannot. Pt had bone spur fixed in his neck 5 weeks ago

## 2016-07-14 NOTE — ED Notes (Signed)
Patient was given a sprite with a cup of ice.

## 2016-07-14 NOTE — ED Provider Notes (Signed)
Emergency Department Provider Note   I have reviewed the triage vital signs and the nursing notes.   HISTORY  Chief Complaint Gastroesophageal Reflux   HPI Michael Whitaker is a 60 y.o. male with PMH of GERD, HLD, HTN presents to the emergency department for evaluation of acute onset difficulty swallowing, nausea, and epigastric abdominal discomfort. The patient states that he was awake at approximately 3 AM and ate some food. Approximately 4 AM he began having difficulty swallowing and felt very nauseated. He coughed up some white/clear sputum and noted some epigastric abdominal discomfort. He denies any chest pain or difficulty breathing. He has not had similar episodes in the past. No known history of esophageal stricture or food impaction. He denies any associated diarrhea or lower abdominal pain. No back pain. Symptoms resolved after approximately one hour of discomfort. The patient did not find any position or other interventions that improved or worsen his symptoms.    Past Medical History:  Diagnosis Date  . GERD (gastroesophageal reflux disease)   . Hyperlipemia   . Hypertension     Patient Active Problem List   Diagnosis Date Noted  . HTN (hypertension) 03/26/2015  . Tobacco abuse 03/26/2015  . Trigger finger, acquired 01/27/2015  . Fracture of fifth metacarpal bone of left hand 01/27/2015    Past Surgical History:  Procedure Laterality Date  . ELBOW SURGERY      Current Outpatient Rx  . Order #: [id number] Class: Historical Med  . Order #: [id number] Class: Print  . Order #: [id number] Class: Historical Med  . Order #: [id number] Class: Print    Allergies Review of patient's allergies indicates no known allergies.  Family History  Problem Relation Age of Onset  . Lung disease Father     Social History Social History  Substance Use Topics  . Smoking status: Current Every Day Smoker    Packs/day: 1.50    Types: Cigarettes  . Smokeless tobacco: Not on file    . Alcohol use 0.0 oz/week     Comment: weekends mostly    Review of Systems  Constitutional: No fever/chills Eyes: No visual changes. ENT: No sore throat. Cardiovascular: Denies chest pain. Respiratory: Denies shortness of breath. Gastrointestinal: Positive epigastric abdominal pain. Positive nausea, no vomiting.  No diarrhea.  No constipation. Genitourinary: Negative for dysuria. Musculoskeletal: Negative for back pain. Skin: Negative for rash. Neurological: Negative for headaches, focal weakness or numbness.  10-point ROS otherwise negative.  ____________________________________________   PHYSICAL EXAM:  VITAL SIGNS: ED Triage Vitals  Enc Vitals Group     BP 07/14/16 0603 154/92     Pulse Rate 07/14/16 0603 81     Resp 07/14/16 0603 16     Temp 07/14/16 0603 98 F (36.7 C)     Temp Source 07/14/16 0603 Oral     SpO2 07/14/16 0603 100 %     Weight 07/14/16 0603 253 lb (114.8 kg)     Height 07/14/16 0603 6\' 1"  (1.854 m)     Pain Score 07/14/16 0602 2   Constitutional: Alert and oriented. Well appearing and in no acute distress. Eyes: Conjunctivae are normal. PERRL.  Head: Atraumatic. Nose: No congestion/rhinnorhea. Mouth/Throat: Mucous membranes are moist.  Oropharynx non-erythematous. Neck: No stridor.   Cardiovascular: Normal rate, regular rhythm. Good peripheral circulation. Grossly normal heart sounds.   Respiratory: Normal respiratory effort.  No retractions. Lungs CTAB. Gastrointestinal: Soft and nontender. No distention.  Musculoskeletal: No lower extremity tenderness nor edema. No gross deformities of extremities. Neurologic:  Normal speech and language. No gross focal neurologic deficits are appreciated.  Skin:  Skin is warm, dry and intact. No rash noted. Psychiatric: Mood and affect are normal. Speech and behavior are normal.  ____________________________________________   LABS (all labs ordered are listed, but only abnormal results are  displayed)  Labs Reviewed  I-STAT CHEM 8, ED - Abnormal; Notable for the following:       Result Value   Glucose, Bld 102 (*)    Calcium, Ion 1.10 (*)    All other components within normal limits  I-STAT TROPOININ, ED   ____________________________________________  EKG  Reviewed EKG in MUSE. No STEMI.  ____________________________________________  RADIOLOGY  Dg Chest 2 View  Result Date: 07/14/2016 CLINICAL DATA:  Chest pain EXAM: CHEST  2 VIEW COMPARISON:  05/26/2016 FINDINGS: The heart size and mediastinal contours are within normal limits. Both lungs are clear. The visualized skeletal structures are unremarkable. IMPRESSION: No active cardiopulmonary disease. Electronically Signed   By: Alcide Clever M.D.   On: 07/14/2016 08:03    ____________________________________________   PROCEDURES  Procedure(s) performed:   Procedures  None ____________________________________________   INITIAL IMPRESSION / ASSESSMENT AND PLAN / ED COURSE  Pertinent labs & imaging results that were available during my care of the patient were reviewed by me and considered in my medical decision making (see chart for details).  Patient resents to the emergency department for evaluation of acute onset nausea, epigastric pain, difficulty swallowing. Symptoms resolved spontaneously. Differential is broad and includes atypical ACS, food impaction now cleared, reflux, or gastritis. Plan for chemistry, H/H, and troponin. Will obtain CXR for further evaluation and to r/o aspiration event with choking sensation. Patient is asymptomatic not. Will give GI cocktail and reassess.   08:08 AM Patient with no pain or nausea at this time. Labs and CXR unremarkable. Plan for discharge with Omeprazole and PCP follow up with discuss outpatient EGD.   At this time, I do not feel there is any life-threatening condition present. I have reviewed and discussed all results (EKG, imaging, lab, urine as appropriate), exam  findings with patient. I have reviewed nursing notes and appropriate previous records.  I feel the patient is safe to be discharged home without further emergent workup. Discussed usual and customary return precautions. Patient and family (if present) verbalize understanding and are comfortable with this plan.  Patient will follow-up with their primary care provider. If they do not have a primary care provider, information for follow-up has been provided to them. All questions have been answered.  ____________________________________________  FINAL CLINICAL IMPRESSION(S) / ED DIAGNOSES  Final diagnoses:  Epigastric abdominal pain  Nausea     MEDICATIONS GIVEN DURING THIS VISIT:  Medications  gi cocktail (Maalox,Lidocaine,Donnatal) (not administered)     NEW OUTPATIENT MEDICATIONS STARTED DURING THIS VISIT:  New Prescriptions   OMEPRAZOLE (PRILOSEC) 20 MG CAPSULE    Take 1 capsule (20 mg total) by mouth daily.      Note:  This document was prepared using Dragon voice recognition software and may include unintentional dictation errors.  Alona Bene, MD Emergency Medicine   Maia Plan, MD 07/14/16 [phone]

## 2016-08-01 DIAGNOSIS — N401 Enlarged prostate with lower urinary tract symptoms: Secondary | ICD-10-CM | POA: Diagnosis not present

## 2016-08-01 DIAGNOSIS — R972 Elevated prostate specific antigen [PSA]: Secondary | ICD-10-CM | POA: Diagnosis not present

## 2016-08-16 DIAGNOSIS — M4712 Other spondylosis with myelopathy, cervical region: Secondary | ICD-10-CM | POA: Diagnosis not present

## 2016-09-05 DIAGNOSIS — R972 Elevated prostate specific antigen [PSA]: Secondary | ICD-10-CM | POA: Diagnosis not present

## 2016-09-05 DIAGNOSIS — N401 Enlarged prostate with lower urinary tract symptoms: Secondary | ICD-10-CM | POA: Diagnosis not present

## 2016-09-13 DIAGNOSIS — M4712 Other spondylosis with myelopathy, cervical region: Secondary | ICD-10-CM | POA: Diagnosis not present

## 2016-09-19 DIAGNOSIS — K219 Gastro-esophageal reflux disease without esophagitis: Secondary | ICD-10-CM | POA: Diagnosis not present

## 2016-09-19 DIAGNOSIS — Z1389 Encounter for screening for other disorder: Secondary | ICD-10-CM | POA: Diagnosis not present

## 2016-09-19 DIAGNOSIS — B070 Plantar wart: Secondary | ICD-10-CM | POA: Diagnosis not present

## 2016-09-19 DIAGNOSIS — Z6832 Body mass index (BMI) 32.0-32.9, adult: Secondary | ICD-10-CM | POA: Diagnosis not present

## 2016-10-14 DIAGNOSIS — M4712 Other spondylosis with myelopathy, cervical region: Secondary | ICD-10-CM | POA: Diagnosis not present

## 2016-10-18 ENCOUNTER — Emergency Department (HOSPITAL_COMMUNITY): Payer: BLUE CROSS/BLUE SHIELD

## 2016-10-18 ENCOUNTER — Emergency Department (HOSPITAL_COMMUNITY)
Admission: EM | Admit: 2016-10-18 | Discharge: 2016-10-18 | Disposition: A | Discharge status: Home / Self Care | Payer: BLUE CROSS/BLUE SHIELD | Attending: Emergency Medicine | Admitting: Emergency Medicine

## 2016-10-18 ENCOUNTER — Encounter (HOSPITAL_COMMUNITY): Payer: Self-pay

## 2016-10-18 DIAGNOSIS — Z79899 Other long term (current) drug therapy: Secondary | ICD-10-CM | POA: Diagnosis not present

## 2016-10-18 DIAGNOSIS — M25511 Pain in right shoulder: Secondary | ICD-10-CM | POA: Insufficient documentation

## 2016-10-18 DIAGNOSIS — G8929 Other chronic pain: Secondary | ICD-10-CM | POA: Diagnosis not present

## 2016-10-18 DIAGNOSIS — I10 Essential (primary) hypertension: Secondary | ICD-10-CM | POA: Insufficient documentation

## 2016-10-18 DIAGNOSIS — F17210 Nicotine dependence, cigarettes, uncomplicated: Secondary | ICD-10-CM | POA: Diagnosis not present

## 2016-10-18 DIAGNOSIS — M25512 Pain in left shoulder: Secondary | ICD-10-CM | POA: Diagnosis not present

## 2016-10-18 DIAGNOSIS — R079 Chest pain, unspecified: Secondary | ICD-10-CM | POA: Diagnosis not present

## 2016-10-18 LAB — CBC
HEMATOCRIT: 43.6 % (ref 39.0–52.0)
HEMOGLOBIN: 14.4 g/dL (ref 13.0–17.0)
MCH: 31 pg (ref 26.0–34.0)
MCHC: 33 g/dL (ref 30.0–36.0)
MCV: 93.8 fL (ref 78.0–100.0)
Platelets: 241 10*3/uL (ref 150–400)
RBC: 4.65 MIL/uL (ref 4.22–5.81)
RDW: 13.2 % (ref 11.5–15.5)
WBC: 8.9 10*3/uL (ref 4.0–10.5)

## 2016-10-18 LAB — BASIC METABOLIC PANEL
ANION GAP: 8 (ref 5–15)
BUN: 11 mg/dL (ref 6–20)
CHLORIDE: 108 mmol/L (ref 101–111)
CO2: 22 mmol/L (ref 22–32)
Calcium: 8.6 mg/dL — ABNORMAL LOW (ref 8.9–10.3)
Creatinine, Ser: 0.75 mg/dL (ref 0.61–1.24)
GFR calc non Af Amer: >60 mL/min (ref >60)
GLUCOSE: 86 mg/dL (ref 65–99)
POTASSIUM: 3.9 mmol/L (ref 3.5–5.1)
Sodium: 138 mmol/L (ref 135–145)

## 2016-10-18 NOTE — ED Triage Notes (Signed)
Onset today pt checked BP 186/110.  C/o left and right sided chest pain, intermittant, 1-2 times an hour, lasting 4-5 min, and shortness of breath when walking up stairs.

## 2016-10-18 NOTE — ED Provider Notes (Signed)
[facility] Provider Note   CSN: [id number] Arrival date & time: 10/18/16  1813  History   Chief Complaint Chief Complaint  Patient presents with  . Hypertension   HPI Becky Augustadward J Dau is a 60 y.o. male.  HPI  Patient to the ER with complaints of GERD, hyperlipidemia, hypertension with complaints of hypertension. He ran out of his BP medication this morning and went to the pharmacy to pick up his refill. While there he decided to check his BP and it was elevated to 186/110.  Per triage noted patient has been having CP with SOB. Patient reports neck surgery in July of 2017, around 2 months afterwards he started having right shoulder pain and pain to his left pectoral region, it has been happening 1-2 times an hour and lasts a few minutes. He says that this has not changed at all. He reports only mentioning it in triage because he was asked about chest pain. He denies ever experiencing any SOB, denies SOB when walking up stairs or during any activity to me, he did tell this to the triage nurse. Pt currently pain free. He plans to call his surgeon tomorrow for follow-up they plan on doing repeat MRI, concern for nerve damage.   Past Medical History:  Diagnosis Date  . GERD (gastroesophageal reflux disease)   . Hyperlipemia   . Hypertension     Patient Active Problem List   Diagnosis Date Noted  . HTN (hypertension) 03/26/2015  . Tobacco abuse 03/26/2015  . Trigger finger, acquired 01/27/2015  . Fracture of fifth metacarpal bone of left hand 01/27/2015    Past Surgical History:  Procedure Laterality Date  . ELBOW SURGERY         Home Medications    Prior to Admission medications   Medication Sig Start Date End Date Taking? Authorizing Provider  lisinopril (PRINIVIL,ZESTRIL) 20 MG tablet Take 20 mg by mouth daily.   Yes Historical Provider, MD  metoprolol succinate (TOPROL-XL) 100 MG 24 hr tablet Take 1 tablet (100 mg total) by mouth daily. Take with or immediately  following a meal. 01/19/15  Yes Gerhard Munchobert Lockwood, MD  omeprazole (PRILOSEC) 20 MG capsule Take 1 capsule (20 mg total) by mouth daily. 07/14/16  Yes Maia PlanJoshua G Long, MD  simvastatin (ZOCOR) 10 MG tablet Take 1 tablet by mouth daily. 03/18/15  Yes Historical Provider, MD    Family History Family History  Problem Relation Age of Onset  . Lung disease Father     Social History Social History  Substance Use Topics  . Smoking status: Current Every Day Smoker    Packs/day: 1.50    Types: Cigarettes  . Smokeless tobacco: Never Used  . Alcohol use 0.0 oz/week     Comment: weekends mostly     Allergies   Patient has no known allergies.   Review of Systems Review of Systems  Review of Systems All other systems negative except as documented in the HPI. All pertinent positives and negatives as reviewed in the HPI.  Physical Exam Updated Vital Signs BP 148/89 (BP Location: Right Arm)   Pulse 68   Temp 98 F (36.7 C) (Oral)   Resp 16   SpO2 100%   Physical Exam  Constitutional: He appears well-developed and well-nourished. No distress.  HENT:  Head: Normocephalic and atraumatic.  Eyes: Pupils are equal, round, and reactive to light.  Neck: Normal range of motion. Neck supple.  Cardiovascular: Normal rate and regular rhythm.   Pulmonary/Chest: Effort normal. He  exhibits tenderness.    Abdominal: Soft.  Musculoskeletal:       Right shoulder: He exhibits decreased range of motion (chronic) and tenderness.  Neurological: He is alert.  Skin: Skin is warm and dry.  Nursing note and vitals reviewed.    ED Treatments / Results  Labs (all labs ordered are listed, but only abnormal results are displayed) Labs Reviewed  BASIC METABOLIC PANEL - Abnormal; Notable for the following:       Result Value   Calcium 8.6 (*)    All other components within normal limits  CBC  I-STAT TROPOININ, ED    EKG  EKG Interpretation  Date/Time:  Tuesday October 18 2016 18:46:43  EST Ventricular Rate:  62 PR Interval:  138 QRS Duration: 92 QT Interval:  408 QTC Calculation: 414 R Axis:   69 Text Interpretation:  Normal sinus rhythm Possible Left atrial enlargement hypertrophic changes No significant change since last tracing Abnormal ekg Confirmed by Gerhard MunchLOCKWOOD, ROBERT  MD ([id number]) on 10/18/2016 6:53:41 PM       Radiology Dg Chest 2 View  Result Date: 10/18/2016 CLINICAL DATA:  Chest pain for 2-3 days. EXAM: CHEST  2 VIEW COMPARISON:  Chest radiograph July 14, 2016 FINDINGS: Cardiomediastinal silhouette is normal. No pleural effusions or focal consolidations. Trachea projects midline and there is no pneumothorax. Soft tissue planes and included osseous structures are non-suspicious. Severe acromioclavicular osteoarthrosis. Cervical spine hardware incompletely imaged. IMPRESSION: No acute cardiopulmonary process ; stable examination. Electronically Signed   By: Awilda Metroourtnay  Bloomer M.D.   On: 10/18/2016 19:12    Procedures Procedures (including critical care time)  Medications Ordered in ED Medications - No data to display   Initial Impression / Assessment and Plan / ED Course  I have reviewed the triage vital signs and the nursing notes.  Pertinent labs & imaging results that were available during my care of the patient were reviewed by me and considered in my medical decision making (see chart for details).  Clinical Course     Blood pressure 148/89, pulse 68, temperature 98 F (36.7 C), temperature source Oral, resp. rate 16, SpO2 100 %.  Patients blood pressure is now 148/89. He is going to call his doctor tomorrow regarding his MRI for his bilateral shoulder and chest wall pain. In triage he had trop, CBC, BMP and the chest xray/EKG are unremarkable, He takes Ibuprofen 800 mg. Discussed return precautions and to continue taking BP medications as prescribed.  Final Clinical Impressions(s) / ED Diagnoses   Final diagnoses:  Chronic pain of both  shoulders  Hypertension, unspecified type    New Prescriptions New Prescriptions   No medications on file     Marlon Peliffany Real Cona, Cordelia Poche-C 10/18/16 2341    Gerhard Munchobert Lockwood, MD 10/22/16 1825

## 2016-10-21 ENCOUNTER — Ambulatory Visit (INDEPENDENT_AMBULATORY_CARE_PROVIDER_SITE_OTHER): Payer: BLUE CROSS/BLUE SHIELD | Admitting: Podiatry

## 2016-10-21 ENCOUNTER — Encounter: Payer: Self-pay | Admitting: Podiatry

## 2016-10-21 DIAGNOSIS — M722 Plantar fascial fibromatosis: Secondary | ICD-10-CM

## 2016-10-21 DIAGNOSIS — Q828 Other specified congenital malformations of skin: Secondary | ICD-10-CM

## 2016-10-21 MED ORDER — TRIAMCINOLONE ACETONIDE 10 MG/ML IJ SUSP
10.0000 mg | Freq: Once | INTRAMUSCULAR | Status: AC
  Administered 2016-10-21: 10 mg

## 2016-10-21 NOTE — Progress Notes (Signed)
Subjective:     Patient ID: Michael Whitaker, male   DOB: 1956/06/15, 60 y.o.   MRN: [id number]  HPI patient states he has a lot of pain on the mid arch area left with lesion formation and keratotic tissue formation that sore when pressed   Review of Systems     Objective:   Physical Exam Neurovascular status intact with very dry skin with what appears to be inflammatory changes of the plantar fascial left with lesion formation also noted that's part of the pathology    Assessment:     Inflammatory fasciitis left with lesion formation    Plan:     H&P condition reviewed and I did do a fascial injection 3 mg Kenalog 5 mg Xylocaine and debrided the lesion fully. Patient be seen back to recheck

## 2016-11-02 DIAGNOSIS — E785 Hyperlipidemia, unspecified: Secondary | ICD-10-CM | POA: Diagnosis not present

## 2016-11-02 DIAGNOSIS — Z6833 Body mass index (BMI) 33.0-33.9, adult: Secondary | ICD-10-CM | POA: Diagnosis not present

## 2016-11-02 DIAGNOSIS — Z79899 Other long term (current) drug therapy: Secondary | ICD-10-CM | POA: Diagnosis not present

## 2016-11-02 DIAGNOSIS — Z2821 Immunization not carried out because of patient refusal: Secondary | ICD-10-CM | POA: Diagnosis not present

## 2016-11-02 DIAGNOSIS — I10 Essential (primary) hypertension: Secondary | ICD-10-CM | POA: Diagnosis not present

## 2016-11-12 DIAGNOSIS — M4712 Other spondylosis with myelopathy, cervical region: Secondary | ICD-10-CM | POA: Diagnosis not present

## 2016-11-12 DIAGNOSIS — M4802 Spinal stenosis, cervical region: Secondary | ICD-10-CM | POA: Diagnosis not present

## 2016-11-15 DIAGNOSIS — M4712 Other spondylosis with myelopathy, cervical region: Secondary | ICD-10-CM | POA: Diagnosis not present

## 2016-11-22 IMAGING — DX DG CHEST 2V
2 series · 2 of 2 positions shown · non-contrast
Comparison: 05/26/2016

CLINICAL DATA: Chest pain

EXAM:
CHEST  2 VIEW

[chest pa]
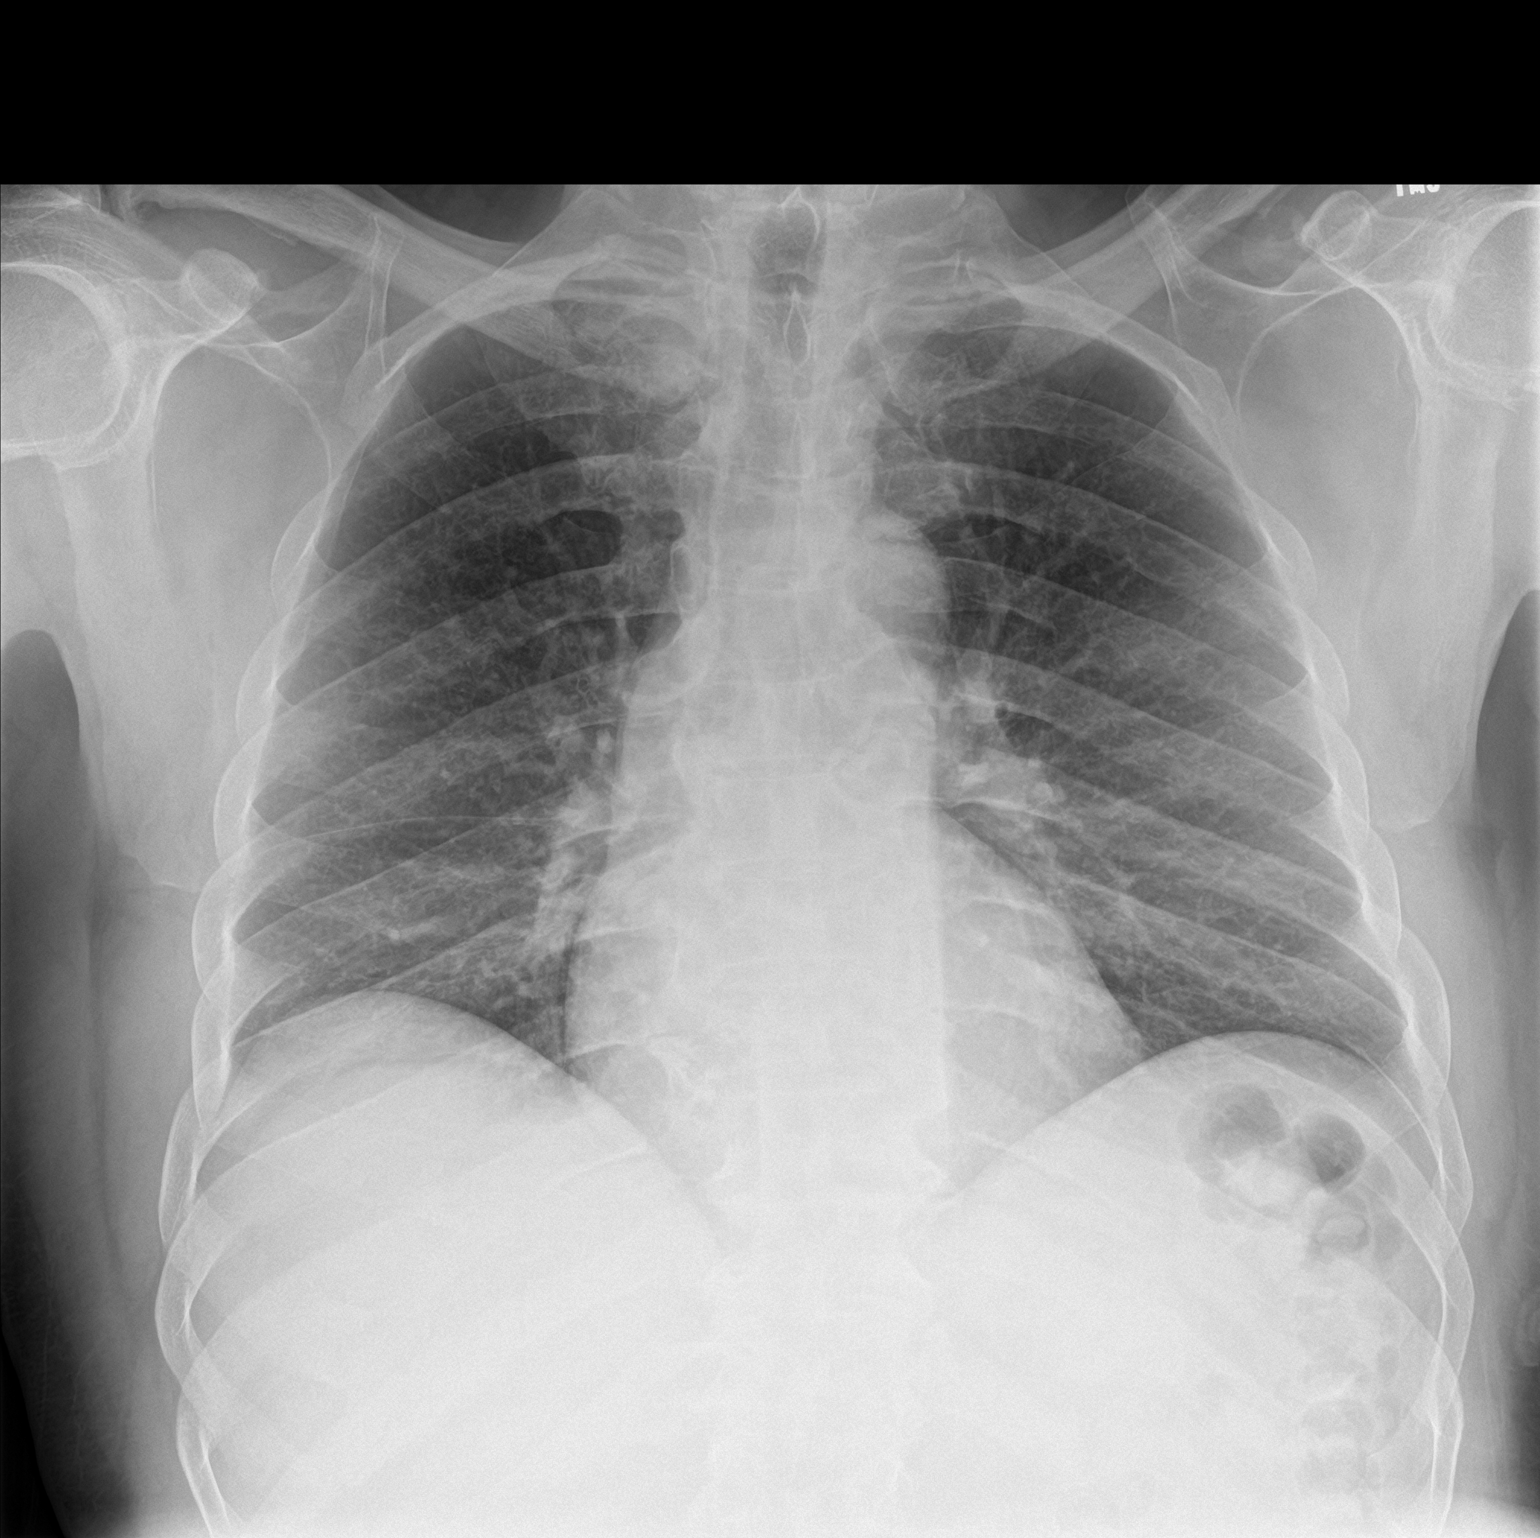

[chest lat]
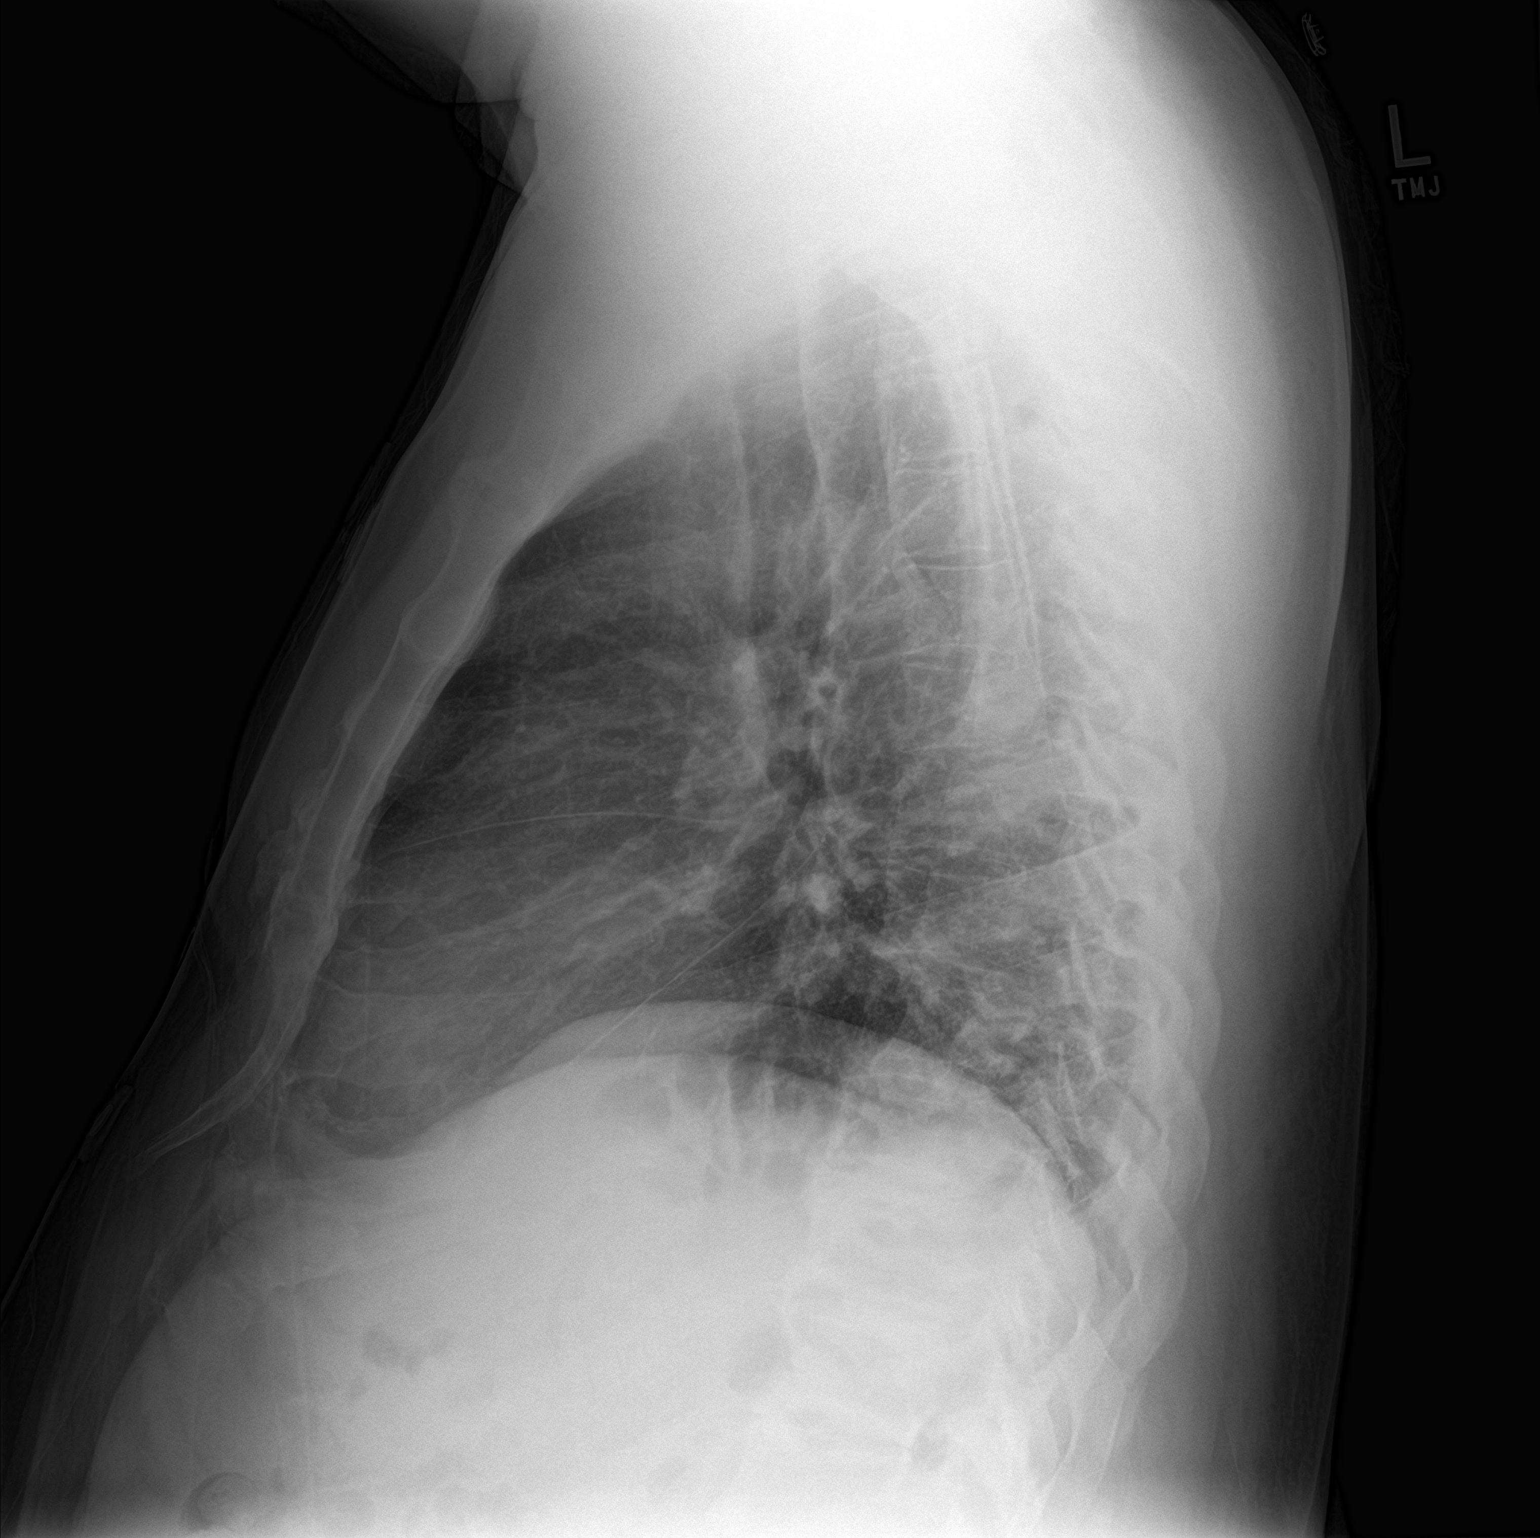

[2 of 2 positions shown; findings below may reference images not displayed]

FINDINGS: The heart size and mediastinal contours are within normal limits.
Both lungs are clear. The visualized skeletal structures are
unremarkable.
IMPRESSION: No active cardiopulmonary disease.

## 2016-12-06 DIAGNOSIS — M4712 Other spondylosis with myelopathy, cervical region: Secondary | ICD-10-CM | POA: Diagnosis not present

## 2016-12-26 DIAGNOSIS — Z6833 Body mass index (BMI) 33.0-33.9, adult: Secondary | ICD-10-CM | POA: Diagnosis not present

## 2016-12-26 DIAGNOSIS — M5412 Radiculopathy, cervical region: Secondary | ICD-10-CM | POA: Diagnosis not present

## 2016-12-27 DIAGNOSIS — M4712 Other spondylosis with myelopathy, cervical region: Secondary | ICD-10-CM | POA: Diagnosis not present

## 2017-01-10 DIAGNOSIS — N401 Enlarged prostate with lower urinary tract symptoms: Secondary | ICD-10-CM | POA: Diagnosis not present

## 2017-01-10 DIAGNOSIS — R972 Elevated prostate specific antigen [PSA]: Secondary | ICD-10-CM | POA: Diagnosis not present

## 2017-01-17 ENCOUNTER — Emergency Department (HOSPITAL_COMMUNITY)
Admission: EM | Admit: 2017-01-17 | Discharge: 2017-01-17 | Disposition: A | Discharge status: Home / Self Care | Payer: BLUE CROSS/BLUE SHIELD | Attending: Emergency Medicine | Admitting: Emergency Medicine

## 2017-01-17 ENCOUNTER — Ambulatory Visit (HOSPITAL_COMMUNITY)
Admission: EM | Admit: 2017-01-17 | Discharge: 2017-01-17 | Disposition: A | Discharge status: Nursing Home (Includes ICF) | Payer: BLUE CROSS/BLUE SHIELD | Source: Home / Self Care

## 2017-01-17 ENCOUNTER — Encounter (HOSPITAL_COMMUNITY): Payer: Self-pay | Admitting: Emergency Medicine

## 2017-01-17 ENCOUNTER — Emergency Department (HOSPITAL_COMMUNITY): Payer: BLUE CROSS/BLUE SHIELD

## 2017-01-17 ENCOUNTER — Encounter (HOSPITAL_COMMUNITY): Payer: Self-pay

## 2017-01-17 DIAGNOSIS — F17210 Nicotine dependence, cigarettes, uncomplicated: Secondary | ICD-10-CM | POA: Diagnosis not present

## 2017-01-17 DIAGNOSIS — J029 Acute pharyngitis, unspecified: Secondary | ICD-10-CM | POA: Diagnosis not present

## 2017-01-17 DIAGNOSIS — R0789 Other chest pain: Secondary | ICD-10-CM | POA: Diagnosis not present

## 2017-01-17 DIAGNOSIS — R079 Chest pain, unspecified: Secondary | ICD-10-CM | POA: Diagnosis not present

## 2017-01-17 DIAGNOSIS — I10 Essential (primary) hypertension: Secondary | ICD-10-CM | POA: Insufficient documentation

## 2017-01-17 DIAGNOSIS — Z79899 Other long term (current) drug therapy: Secondary | ICD-10-CM | POA: Diagnosis not present

## 2017-01-17 LAB — BASIC METABOLIC PANEL
ANION GAP: 8 (ref 5–15)
BUN: 9 mg/dL (ref 6–20)
CHLORIDE: 105 mmol/L (ref 101–111)
CO2: 26 mmol/L (ref 22–32)
Calcium: 8.5 mg/dL — ABNORMAL LOW (ref 8.9–10.3)
Creatinine, Ser: 0.8 mg/dL (ref 0.61–1.24)
GFR calc Af Amer: >60 mL/min (ref >60)
Glucose, Bld: 88 mg/dL (ref 65–99)
POTASSIUM: 4.4 mmol/L (ref 3.5–5.1)
SODIUM: 139 mmol/L (ref 135–145)

## 2017-01-17 LAB — I-STAT TROPONIN, ED: TROPONIN I, POC: 0 ng/mL (ref 0.00–0.08)

## 2017-01-17 LAB — CBC
HCT: 42.8 % (ref 39.0–52.0)
HEMOGLOBIN: 13.9 g/dL (ref 13.0–17.0)
MCH: 30.6 pg (ref 26.0–34.0)
MCHC: 32.5 g/dL (ref 30.0–36.0)
MCV: 94.3 fL (ref 78.0–100.0)
Platelets: 222 10*3/uL (ref 150–400)
RBC: 4.54 MIL/uL (ref 4.22–5.81)
RDW: 13.2 % (ref 11.5–15.5)
WBC: 6.4 10*3/uL (ref 4.0–10.5)

## 2017-01-17 MED ORDER — NITROGLYCERIN 0.4 MG SL SUBL
SUBLINGUAL_TABLET | SUBLINGUAL | Status: AC
  Filled 2017-01-17: qty 1

## 2017-01-17 MED ORDER — ASPIRIN 81 MG PO CHEW
324.0000 mg | CHEWABLE_TABLET | Freq: Once | ORAL | Status: AC
  Administered 2017-01-17: 324 mg via ORAL

## 2017-01-17 MED ORDER — ASPIRIN 81 MG PO CHEW
CHEWABLE_TABLET | ORAL | Status: AC
  Filled 2017-01-17: qty 4

## 2017-01-17 MED ORDER — SODIUM CHLORIDE 0.9 % IV SOLN
Freq: Once | INTRAVENOUS | Status: AC
  Administered 2017-01-17: 12:00:00 via INTRAVENOUS

## 2017-01-17 NOTE — ED Triage Notes (Signed)
[facility]- pt here for chest pain, pinpoint under the left arm. Pt had 324 of aspirin. No Nitro, 20g RAC. Vital signs stable. Last pain scale was 4/10.

## 2017-01-17 NOTE — ED Provider Notes (Signed)
CSN: [id number]     Arrival date & time 01/17/17  1050 History   First MD Initiated Contact with Patient 01/17/17 1142     Chief Complaint  Patient presents with  . Sore Throat  . Chest Pain   (Consider location/radiation/quality/duration/timing/severity/associated sxs/prior Treatment) Patient c/o chest pain that started yesterday and restarted up again today approx 2 hours ago.  He has hx of HTN and hyperlipidemia.  He reports no family hx of CAD.   The history is provided by the patient.  Sore Throat  This is a new problem. Associated symptoms include chest pain. Nothing aggravates the symptoms. Nothing relieves the symptoms. He has tried nothing for the symptoms.  Chest Pain    Past Medical History:  Diagnosis Date  . GERD (gastroesophageal reflux disease)   . Hyperlipemia   . Hypertension    Past Surgical History:  Procedure Laterality Date  . ELBOW SURGERY     Family History  Problem Relation Age of Onset  . Lung disease Father    Social History  Substance Use Topics  . Smoking status: Current Every Day Smoker    Packs/day: 1.50    Types: Cigarettes  . Smokeless tobacco: Never Used  . Alcohol use 0.0 oz/week     Comment: weekends mostly    Review of Systems  Constitutional: Negative.   HENT: Negative.   Eyes: Negative.   Respiratory: Negative.   Cardiovascular: Positive for chest pain.  Gastrointestinal: Negative.   Endocrine: Negative.   Genitourinary: Negative.   Musculoskeletal: Negative.   Allergic/Immunologic: Negative.   Neurological: Negative.   Hematological: Negative.   Psychiatric/Behavioral: Negative.     Allergies  Patient has no known allergies.  Home Medications   Prior to Admission medications   Medication Sig Start Date End Date Taking? Authorizing Provider  gabapentin (NEURONTIN) 300 MG capsule Take 300 mg by mouth 3 (three) times daily.   Yes Historical Provider, MD  lisinopril (PRINIVIL,ZESTRIL) 20 MG tablet Take 20 mg by mouth  daily.   Yes Historical Provider, MD  metoprolol succinate (TOPROL-XL) 100 MG 24 hr tablet Take 1 tablet (100 mg total) by mouth daily. Take with or immediately following a meal. 01/19/15  Yes Gerhard Munch, MD  omeprazole (PRILOSEC) 20 MG capsule Take 1 capsule (20 mg total) by mouth daily. 07/14/16  Yes Maia Plan, MD  simvastatin (ZOCOR) 10 MG tablet Take 1 tablet by mouth daily. 03/18/15  Yes Historical Provider, MD   Meds Ordered and Administered this Visit   Medications  aspirin chewable tablet 324 mg (324 mg Oral Given 01/17/17 1136)    BP 142/80 (BP Location: Right Arm)   Pulse 75   Temp 98 F (36.7 C) (Oral)   Resp 18   SpO2 99%  No data found.   Physical Exam  Constitutional: He appears well-developed and well-nourished.  HENT:  Head: Normocephalic and atraumatic.  Right Ear: External ear normal.  Left Ear: External ear normal.  Mouth/Throat: Oropharynx is clear and moist.  Eyes: Conjunctivae and EOM are normal. Pupils are equal, round, and reactive to light.  Neck: Normal range of motion. Neck supple.  Cardiovascular: Normal rate, regular rhythm and normal heart sounds.   Pulmonary/Chest: Effort normal and breath sounds normal.  Abdominal: Soft. Bowel sounds are normal.  Nursing note and vitals reviewed.   Urgent Care Course     Procedures (including critical care time)  Labs Review Labs Reviewed - No data to display  Imaging Review No results found.  Visual Acuity Review  Right Eye Distance:   Left Eye Distance:   Bilateral Distance:    Right Eye Near:   Left Eye Near:    Bilateral Near:         MDM   1. Other chest pain    Called [facility] and was advised this is not STEMI and to have patient transported to ED   [facility] was moderate left sided chest wall and lateral chest and was not reproducible.  He states the pain radiates to left shoulder and goes down arm.  He did have the pain yesterday.  He states the  pain started today approx 2 hours ago at work.  ASA given IV Cardiac monitor Oxygen  Transported to ED via [facility].     Deatra CanterWilliam J Oxford, FNP 01/17/17 [id number]

## 2017-01-17 NOTE — ED Triage Notes (Signed)
[facility] a [facility] of a sore throat and left sided chest pain under his arm. The patient rated the pain at 5/10 and described it as a throbbing. The patient denied any radiation or N/V/D.

## 2017-01-17 NOTE — ED Provider Notes (Signed)
[facility] Provider Note   CSN: [id number] Arrival date & time: 01/17/17  1215     History   Chief Complaint Chief Complaint  Patient presents with  . Chest Pain    HPI Michael Whitaker is a 61 y.o. male.  Patient presents to the emergency department with chief complaint of left-sided chest pain. He states that he has had the symptoms intermittently for the past several weeks to months. He denies any associated shortness breath, nausea, diaphoresis, or pain radiating to his left arm or jaw. He states that he has a history of cervical radiculopathy, and has pain that radiates from his neck to his shoulder. He denies having taken anything for her symptoms. There are no other associated symptoms or modifying factors. He states that he came to the emergency department today because he had some time and wanted to be check out.   The history is provided by the patient. No language interpreter was used.    Past Medical History:  Diagnosis Date  . GERD (gastroesophageal reflux disease)   . Hyperlipemia   . Hypertension     Patient Active Problem List   Diagnosis Date Noted  . HTN (hypertension) 03/26/2015  . Tobacco abuse 03/26/2015  . Trigger finger, acquired 01/27/2015  . Fracture of fifth metacarpal bone of left hand 01/27/2015    Past Surgical History:  Procedure Laterality Date  . ELBOW SURGERY         Home Medications    Prior to Admission medications   Medication Sig Start Date End Date Taking? Authorizing Provider  gabapentin (NEURONTIN) 300 MG capsule Take 300 mg by mouth 3 (three) times daily.    Historical Provider, MD  lisinopril (PRINIVIL,ZESTRIL) 20 MG tablet Take 20 mg by mouth daily.    Historical Provider, MD  metoprolol succinate (TOPROL-XL) 100 MG 24 hr tablet Take 1 tablet (100 mg total) by mouth daily. Take with or immediately following a meal. 01/19/15   Gerhard Munch, MD  omeprazole (PRILOSEC) 20 MG capsule Take 1 capsule (20 mg total) by  mouth daily. 07/14/16   Maia Plan, MD  simvastatin (ZOCOR) 10 MG tablet Take 1 tablet by mouth daily. 03/18/15   Historical Provider, MD    Family History Family History  Problem Relation Age of Onset  . Lung disease Father     Social History Social History  Substance Use Topics  . Smoking status: Current Every Day Smoker    Packs/day: 1.50    Types: Cigarettes  . Smokeless tobacco: Never Used  . Alcohol use 0.0 oz/week     Comment: weekends mostly     Allergies   Patient has no known allergies.   Review of Systems Review of Systems  All other systems reviewed and are negative.    Physical Exam Updated Vital Signs BP 98/86   Pulse 76   Temp 98.9 F (37.2 C) (Oral)   Resp 23   SpO2 100%   Physical Exam  Constitutional: He is oriented to person, place, and time. He appears well-developed and well-nourished.  HENT:  Head: Normocephalic and atraumatic.  Eyes: Conjunctivae and EOM are normal. Pupils are equal, round, and reactive to light. Right eye exhibits no discharge. Left eye exhibits no discharge. No scleral icterus.  Neck: Normal range of motion. Neck supple. No JVD present.  Cardiovascular: Normal rate, regular rhythm and normal heart sounds.  Exam reveals no gallop and no friction rub.   No murmur heard. Pulmonary/Chest: Effort normal and  breath sounds normal. No respiratory distress. He has no wheezes. He has no rales. He exhibits no tenderness.  Abdominal: Soft. He exhibits no distension and no mass. There is no tenderness. There is no rebound and no guarding.  Musculoskeletal: Normal range of motion. He exhibits no edema or tenderness.  Neurological: He is alert and oriented to person, place, and time.  Skin: Skin is warm and dry.  Psychiatric: He has a normal mood and affect. His behavior is normal. Judgment and thought content normal.  Nursing note and vitals reviewed.    ED Treatments / Results  Labs (all labs ordered are listed, but only  abnormal results are displayed) Labs Reviewed  BASIC METABOLIC PANEL - Abnormal; Notable for the following:       Result Value   Calcium 8.5 (*)    All other components within normal limits  CBC  I-STAT TROPOININ, ED    EKG  EKG Interpretation None       Radiology Dg Chest 2 View  Result Date: 01/17/2017 CLINICAL DATA:  Left-sided chest pain for 1 day EXAM: CHEST  2 VIEW COMPARISON:  10/18/2016 FINDINGS: Heart and mediastinal contours are within normal limits. No focal opacities or effusions. No acute bony abnormality. IMPRESSION: No active cardiopulmonary disease. Electronically Signed   By: Charlett NoseKevin  Dover M.D.   On: 01/17/2017 13:44    Procedures Procedures (including critical care time)  Medications Ordered in ED Medications - No data to display   Initial Impression / Assessment and Plan / ED Course  I have reviewed the triage vital signs and the nursing notes.  Pertinent labs & imaging results that were available during my care of the patient were reviewed by me and considered in my medical decision making (see chart for details).     Patient with pain that has radiated around his left chest intermittently for the past several weeks to months. No shortness of breath. No cough or fever. I doubt ACS, given the patient's symptoms have been intermittent for weeks and are not exertional.  There are not other associated symptoms.  Patient does have a hx of cervical radiculopathy.  Laboratory workup is reassuring.  Plan for discharge to home with PCP follow-up.  Return precautions given.  Final Clinical Impressions(s) / ED Diagnoses   Final diagnoses:  Nonspecific chest pain    New Prescriptions New Prescriptions   No medications on file     Roxy HorsemanRobert Latanya Hemmer, PA-C 01/17/17 1446    Azalia BilisKevin Campos, MD 01/18/17 1715

## 2017-01-17 NOTE — ED Notes (Signed)
Pt did not need anything at this time  

## 2017-01-17 NOTE — ED Notes (Signed)
[facility]  [facility]  [facility]  [facility]   Notified

## 2017-01-18 DIAGNOSIS — M4712 Other spondylosis with myelopathy, cervical region: Secondary | ICD-10-CM | POA: Diagnosis not present

## 2017-01-22 NOTE — Progress Notes (Signed)
Cardiology Office Note   Date:  01/23/2017   ID:  Michael Augustadward J Schauer, DOB [birth year], MRN [id number]  PCP:  Wilmer FloorAMPBELL, STEPHEN D., MD  Cardiologist:   Michael RotundaJames Codee Bloodworth, MD   Chief Complaint  Patient presents with  . Chest Pain      History of Present Illness: Michael Whitaker is a 61 y.o. male who presents for evaluation of hypertension palpitations. He was in the emergency room in late March of last year and I saw him in April for palpitations and a murmur.   Echo was essentially normal.   He was in the ED earlier this month with chest pain.  I reviewed these records.  This was felt to be non anginal.  Trop was neg and EKG was normal.  He has since had a continued left upper chest pain.  This is a throbbing discomfort. It comes and goes. He noted after he had his neck surgery several weeks ago. Was improved with nerve pills and muscle relaxants.  He denies substernal pressure. He's had no jaw discomfort. He's not able to bring his discomfort on. He's had no associated nausea vomiting or diaphoresis. He's had no new shortness of breath, PND or orthopnea. Has had none of the palpitations that he had previously.  Past Medical History:  Diagnosis Date  . GERD (gastroesophageal reflux disease)   . Hyperlipemia   . Hypertension     Past Surgical History:  Procedure Laterality Date  . CERVICAL SPINE SURGERY    . ELBOW SURGERY    . UMBILICAL HERNIA REPAIR       Current Outpatient Prescriptions  Medication Sig Dispense Refill  . gabapentin (NEURONTIN) 300 MG capsule Take 300 mg by mouth 3 (three) times daily.    Marland Kitchen. lisinopril (PRINIVIL,ZESTRIL) 20 MG tablet Take 20 mg by mouth daily.    . metoprolol succinate (TOPROL-XL) 100 MG 24 hr tablet Take 1 tablet (100 mg total) by mouth daily. Take with or immediately following a meal. 30 tablet 0  . omeprazole (PRILOSEC) 20 MG capsule Take 1 capsule (20 mg total) by mouth daily. 30 capsule 1  . simvastatin (ZOCOR) 10 MG tablet Take 1 tablet by  mouth daily.    . tamsulosin (FLOMAX) 0.4 MG CAPS capsule Take 0.4 mg by mouth daily after supper.     . traMADol (ULTRAM) 50 MG tablet Take 50 mg by mouth every 12 (twelve) hours as needed.      No current facility-administered medications for this visit.     Allergies:   Patient has no known allergies.    ROS:  Please see the history of present illness.   Otherwise, review of systems are positive for neck pain, hand tingling.   All other systems are reviewed and negative.    PHYSICAL EXAM: VS:  BP 140/83   Pulse 71   Ht 6\' 1"  (1.854 m)   Wt 275 lb (124.7 kg)   BMI 36.28 kg/m  , BMI Body mass index is 36.28 kg/m. GENERAL:  Well appearing HEENT:  Pupils equal round and reactive, fundi not visualized, oral mucosa unremarkable, upper dentures.  NECK:  No jugular venous distention, waveform within normal limits, carotid upstroke brisk and symmetric, no bruits, no thyromegaly LYMPHATICS:  No cervical, inguinal adenopathy LUNGS:  Clear to auscultation bilaterally BACK:  No CVA tenderness CHEST:  Unremarkable HEART:  PMI not displaced or sustained,S1 and S2 within normal limits, no S3, no S4, no clicks, no rubs, 2 out of 6  apical systolic murmur heard best at the right upper sternal border and early peaking, no diastolic murmurs ABD:  Flat, positive bowel sounds normal in frequency in pitch, no bruits, no rebound, no guarding, no midline pulsatile mass, no hepatomegaly, no splenomegaly EXT:  2 plus pulses throughout, no edema, no cyanosis no clubbing SKIN:  No rashes no nodules NEURO:  Cranial nerves II through XII grossly intact, motor grossly intact throughout PSYCH:  Cognitively intact, oriented to person place and time    EKG:  EKG is not ordered today. The ekg ordered 01/17/17 demonstrates sinus rhythm, rate 62, axis within normal limits, intervals within normal limits, left ventricular ectopy by voltage criteria   Recent Labs: 01/17/2017: BUN 9; Creatinine, Ser 0.80;  Hemoglobin 13.9; Platelets 222; Potassium 4.4; Sodium 139    Lipid Panel No results found for: CHOL, TRIG, HDL, CHOLHDL, VLDL, LDLCALC, LDLDIRECT    Wt Readings from Last 3 Encounters:  01/23/17 275 lb (124.7 kg)  07/14/16 253 lb (114.8 kg)  08/18/15 258 lb (117 kg)      Other studies Reviewed: Additional studies/ records that were reviewed today include: ER records. Review of the above records demonstrates:  Please see elsewhere in the note.     ASSESSMENT AND PLAN:  CHEST PAIN:  The pretest probability of obstructive coronary disease is somewhat low. I will bring the patient back for a POET (Plain Old Exercise Test). This will allow me to screen for obstructive coronary disease, risk stratify and very importantly provide a prescription for exercise.  HTN:   His blood pressure is now controlled. He will continue the meds as listed.  PALPITATIONS:  The patient has had no further palpitations. No change in therapy is indicated.  TOBACCO:   He quit !!!  OVERWEIGHT:  We had a long discussion about diet and in particular exercise. He doesn't exercise at all.  MURMUR:  This was mild sclerosis.  No change in therapy or further imaging is indicated.    Current medicines are reviewed at length with the patient today.  The patient does not have concerns regarding medicines.  The following changes have been made:  no change  Labs/ tests ordered today include:  POET (Plain Old Exercise Treadmill)    Disposition:   FU with me as needed    Signed, Michael Rotunda, MD  01/23/2017 9:52 AM    [facility]

## 2017-01-23 ENCOUNTER — Ambulatory Visit (INDEPENDENT_AMBULATORY_CARE_PROVIDER_SITE_OTHER): Payer: BLUE CROSS/BLUE SHIELD | Admitting: Cardiology

## 2017-01-23 ENCOUNTER — Encounter: Payer: Self-pay | Admitting: Cardiology

## 2017-01-23 VITALS — BP 140/83 | HR 71 | Ht 73.0 in | Wt 275.0 lb

## 2017-01-23 DIAGNOSIS — R079 Chest pain, unspecified: Secondary | ICD-10-CM | POA: Diagnosis not present

## 2017-01-23 NOTE — Patient Instructions (Signed)

## 2017-01-31 DIAGNOSIS — M4712 Other spondylosis with myelopathy, cervical region: Secondary | ICD-10-CM | POA: Diagnosis not present

## 2017-02-01 DIAGNOSIS — R0789 Other chest pain: Secondary | ICD-10-CM | POA: Diagnosis not present

## 2017-02-01 DIAGNOSIS — Z6835 Body mass index (BMI) 35.0-35.9, adult: Secondary | ICD-10-CM | POA: Diagnosis not present

## 2017-02-01 DIAGNOSIS — Z79899 Other long term (current) drug therapy: Secondary | ICD-10-CM | POA: Diagnosis not present

## 2017-02-08 ENCOUNTER — Telehealth (HOSPITAL_COMMUNITY): Payer: Self-pay

## 2017-02-08 NOTE — Telephone Encounter (Signed)
Encounter complete. 

## 2017-02-09 ENCOUNTER — Telehealth (HOSPITAL_COMMUNITY): Payer: Self-pay

## 2017-02-09 NOTE — Telephone Encounter (Signed)
Encounter complete. 

## 2017-02-10 ENCOUNTER — Ambulatory Visit (HOSPITAL_COMMUNITY)
Admission: RE | Admit: 2017-02-10 | Discharge: 2017-02-10 | Disposition: A | Discharge status: Home / Self Care | Payer: BLUE CROSS/BLUE SHIELD | Source: Ambulatory Visit | Attending: Cardiology | Admitting: Cardiology

## 2017-02-10 DIAGNOSIS — R079 Chest pain, unspecified: Secondary | ICD-10-CM

## 2017-02-22 ENCOUNTER — Telehealth (HOSPITAL_COMMUNITY): Payer: Self-pay

## 2017-02-22 NOTE — Telephone Encounter (Signed)
Encounter complete. 

## 2017-02-23 ENCOUNTER — Telehealth (HOSPITAL_COMMUNITY): Payer: Self-pay

## 2017-02-23 NOTE — Telephone Encounter (Signed)
Encounter complete. 

## 2017-02-24 ENCOUNTER — Ambulatory Visit (HOSPITAL_COMMUNITY)
Admission: RE | Admit: 2017-02-24 | Discharge: 2017-02-24 | Disposition: A | Discharge status: Home / Self Care | Payer: BLUE CROSS/BLUE SHIELD | Source: Ambulatory Visit | Attending: Cardiology | Admitting: Cardiology

## 2017-02-24 ENCOUNTER — Encounter (HOSPITAL_COMMUNITY): Payer: Self-pay | Admitting: *Deleted

## 2017-02-24 DIAGNOSIS — R079 Chest pain, unspecified: Secondary | ICD-10-CM | POA: Diagnosis not present

## 2017-02-24 LAB — EXERCISE TOLERANCE TEST
CHL RATE OF PERCEIVED EXERTION: 18
CSEPED: 6 min
CSEPEW: 8.4 METS
CSEPPHR: 142 {beats}/min
Exercise duration (sec): 56 s
MPHR: 162 {beats}/min
Percent HR: 87 %
Rest HR: 67 {beats}/min

## 2017-02-24 NOTE — Progress Notes (Unsigned)
Abnormal ETT was reviewed by Dr. SwazilandJordan. Patient was given the ok to be discharged home.

## 2017-02-27 ENCOUNTER — Telehealth: Payer: Self-pay | Admitting: *Deleted

## 2017-02-27 DIAGNOSIS — Z79899 Other long term (current) drug therapy: Secondary | ICD-10-CM

## 2017-02-27 MED ORDER — CHLORTHALIDONE 25 MG PO TABS: 25.0000 mg | ORAL_TABLET | Freq: Every day | ORAL | 3 refills | Status: DC

## 2017-02-27 NOTE — Telephone Encounter (Signed)
-----   Message from Rollene RotundaJames Hochrein, MD sent at 02/26/2017  9:25 PM EDT ----- He had a normal POET (Plain Old Exercise Treadmill) except for a marked BP increase.  I would like to add chlorthalidone 25 mg po daily disp number 90 with 3 refills and get a BMET in two weeks.  Call Mr. Kelby FamManuel with the results and send results to CAMPBELL, Mila HomerSTEPHEN D., MD

## 2017-02-27 NOTE — Telephone Encounter (Signed)
Pt aware of his stress test,  Rx has been sent to the pharmacy electronically. BMP ordered and mail to pt

## 2017-03-01 DIAGNOSIS — I10 Essential (primary) hypertension: Secondary | ICD-10-CM | POA: Diagnosis not present

## 2017-03-01 DIAGNOSIS — E785 Hyperlipidemia, unspecified: Secondary | ICD-10-CM | POA: Diagnosis not present

## 2017-03-01 DIAGNOSIS — Z6834 Body mass index (BMI) 34.0-34.9, adult: Secondary | ICD-10-CM | POA: Diagnosis not present

## 2017-03-01 DIAGNOSIS — F17200 Nicotine dependence, unspecified, uncomplicated: Secondary | ICD-10-CM | POA: Diagnosis not present

## 2017-03-20 DIAGNOSIS — Z79899 Other long term (current) drug therapy: Secondary | ICD-10-CM | POA: Diagnosis not present

## 2017-03-20 LAB — BASIC METABOLIC PANEL
BUN: 12 mg/dL (ref 7–25)
CO2: 28 mmol/L (ref 20–31)
CREATININE: 0.73 mg/dL (ref 0.70–1.33)
Calcium: 8.7 mg/dL (ref 8.6–10.3)
Chloride: 105 mmol/L (ref 98–110)
Glucose, Bld: 116 mg/dL — ABNORMAL HIGH (ref 65–99)
POTASSIUM: 4.5 mmol/L (ref 3.5–5.3)
Sodium: 139 mmol/L (ref 135–146)

## 2017-03-24 DIAGNOSIS — M1711 Unilateral primary osteoarthritis, right knee: Secondary | ICD-10-CM | POA: Diagnosis not present

## 2017-04-04 DIAGNOSIS — Z6834 Body mass index (BMI) 34.0-34.9, adult: Secondary | ICD-10-CM | POA: Diagnosis not present

## 2017-04-04 DIAGNOSIS — Z79899 Other long term (current) drug therapy: Secondary | ICD-10-CM | POA: Diagnosis not present

## 2017-04-04 DIAGNOSIS — M1711 Unilateral primary osteoarthritis, right knee: Secondary | ICD-10-CM | POA: Diagnosis not present

## 2017-04-04 DIAGNOSIS — I10 Essential (primary) hypertension: Secondary | ICD-10-CM | POA: Diagnosis not present

## 2017-04-27 DIAGNOSIS — L0390 Cellulitis, unspecified: Secondary | ICD-10-CM | POA: Diagnosis not present

## 2017-04-27 DIAGNOSIS — Z6834 Body mass index (BMI) 34.0-34.9, adult: Secondary | ICD-10-CM | POA: Diagnosis not present

## 2017-04-27 DIAGNOSIS — M25561 Pain in right knee: Secondary | ICD-10-CM | POA: Diagnosis not present

## 2017-04-27 DIAGNOSIS — Z79899 Other long term (current) drug therapy: Secondary | ICD-10-CM | POA: Diagnosis not present

## 2017-05-02 DIAGNOSIS — M4712 Other spondylosis with myelopathy, cervical region: Secondary | ICD-10-CM | POA: Diagnosis not present

## 2017-05-02 DIAGNOSIS — M1711 Unilateral primary osteoarthritis, right knee: Secondary | ICD-10-CM | POA: Diagnosis not present

## 2017-05-19 DIAGNOSIS — M1711 Unilateral primary osteoarthritis, right knee: Secondary | ICD-10-CM | POA: Diagnosis not present

## 2017-05-26 DIAGNOSIS — M1711 Unilateral primary osteoarthritis, right knee: Secondary | ICD-10-CM | POA: Diagnosis not present

## 2017-05-30 ENCOUNTER — Encounter (HOSPITAL_COMMUNITY): Payer: Self-pay | Admitting: *Deleted

## 2017-05-30 ENCOUNTER — Emergency Department (HOSPITAL_COMMUNITY)
Admission: EM | Admit: 2017-05-30 | Discharge: 2017-05-30 | Disposition: A | Discharge status: Home / Self Care | Payer: BLUE CROSS/BLUE SHIELD | Attending: Emergency Medicine | Admitting: Emergency Medicine

## 2017-05-30 ENCOUNTER — Emergency Department (HOSPITAL_COMMUNITY): Payer: BLUE CROSS/BLUE SHIELD

## 2017-05-30 DIAGNOSIS — J069 Acute upper respiratory infection, unspecified: Secondary | ICD-10-CM

## 2017-05-30 DIAGNOSIS — R079 Chest pain, unspecified: Secondary | ICD-10-CM | POA: Diagnosis not present

## 2017-05-30 DIAGNOSIS — F17210 Nicotine dependence, cigarettes, uncomplicated: Secondary | ICD-10-CM | POA: Insufficient documentation

## 2017-05-30 DIAGNOSIS — Z79899 Other long term (current) drug therapy: Secondary | ICD-10-CM | POA: Insufficient documentation

## 2017-05-30 DIAGNOSIS — I10 Essential (primary) hypertension: Secondary | ICD-10-CM | POA: Insufficient documentation

## 2017-05-30 LAB — I-STAT TROPONIN, ED: TROPONIN I, POC: 0.01 ng/mL (ref 0.00–0.08)

## 2017-05-30 LAB — BASIC METABOLIC PANEL
Anion gap: 6 (ref 5–15)
BUN: 8 mg/dL (ref 6–20)
CALCIUM: 8.4 mg/dL — AB (ref 8.9–10.3)
CO2: 26 mmol/L (ref 22–32)
Chloride: 106 mmol/L (ref 101–111)
Creatinine, Ser: 0.8 mg/dL (ref 0.61–1.24)
GFR calc Af Amer: >60 mL/min (ref >60)
GLUCOSE: 92 mg/dL (ref 65–99)
Potassium: 3.9 mmol/L (ref 3.5–5.1)
Sodium: 138 mmol/L (ref 135–145)

## 2017-05-30 LAB — CBC
HCT: 42.4 % (ref 39.0–52.0)
Hemoglobin: 13.7 g/dL (ref 13.0–17.0)
MCH: 30.4 pg (ref 26.0–34.0)
MCHC: 32.3 g/dL (ref 30.0–36.0)
MCV: 94.2 fL (ref 78.0–100.0)
Platelets: 251 10*3/uL (ref 150–400)
RBC: 4.5 MIL/uL (ref 4.22–5.81)
RDW: 13.2 % (ref 11.5–15.5)
WBC: 8.8 10*3/uL (ref 4.0–10.5)

## 2017-05-30 NOTE — ED Triage Notes (Signed)
Pt has c/o intermittent h/a, cp and hypertension. Pt states he called the ambulance to check his BP rt not having money to go to the [facility] for a check up. Pt states EMS told him his pressure was high so he then came here to get it checked again. Pt in NAD upon triage.

## 2017-05-30 NOTE — ED Provider Notes (Signed)
[facility]Y DEPT Provider Note   CSN: [id number] Arrival date & time: 05/30/17  [id number]     History   Chief Complaint Chief Complaint  Patient presents with  . Hypertension  . Chest Pain  . Headache    HPI Michael Whitaker is a 61 y.o. male.  61 yo M with a chief complaint of posterior headaches, hypertension, chest pain. Describes the chest pain is sharp and fleeting. Last for about a second at a time. Denies radiation. Denies exertional symptoms. Denies shortness of breath denies diaphoresis.  Complaining of a posterior headache usually occurs at the end of work. Nothing seems to make this better or worse. Usually resolves on their own. Denies any other neurologic complaints including weakness numbness tingling. Has had URI-like symptoms over the past 3 or 4 days. Cough mild sore throat rhinorrhea. Denies fevers. Denies shortness breath.   The history is provided by the patient.  Hypertension  Associated symptoms include chest pain and headaches. Pertinent negatives include no abdominal pain and no shortness of breath.  Chest Pain   Associated symptoms include cough and headaches. Pertinent negatives include no abdominal pain, no fever, no palpitations, no shortness of breath and no vomiting.  His past medical history is significant for hypertension.  Headache   Pertinent negatives include no fever, no palpitations, no shortness of breath and no vomiting.  Illness  This is a new problem. The current episode started more than 1 week ago. The problem occurs constantly. The problem has not changed since onset.Associated symptoms include chest pain and headaches. Pertinent negatives include no abdominal pain and no shortness of breath. Nothing aggravates the symptoms.    Past Medical History:  Diagnosis Date  . GERD (gastroesophageal reflux disease)   . Hyperlipemia   . Hypertension     Patient Active Problem List   Diagnosis Date Noted  . HTN (hypertension) 03/26/2015  .  Tobacco abuse 03/26/2015  . Trigger finger, acquired 01/27/2015  . Fracture of fifth metacarpal bone of left hand 01/27/2015    Past Surgical History:  Procedure Laterality Date  . CERVICAL SPINE SURGERY    . ELBOW SURGERY    . UMBILICAL HERNIA REPAIR         Home Medications    Prior to Admission medications   Medication Sig Start Date End Date Taking? Authorizing Provider  gabapentin (NEURONTIN) 300 MG capsule Take 300 mg by mouth 3 (three) times daily.   Yes [provider]  lisinopril (PRINIVIL,ZESTRIL) 20 MG tablet Take 20 mg by mouth daily.   Yes [provider]  metoprolol succinate (TOPROL-XL) 100 MG 24 hr tablet Take 1 tablet (100 mg total) by mouth daily. Take with or immediately following a meal. 01/19/15  Yes Gerhard MunchLockwood, Robert, MD  simvastatin (ZOCOR) 10 MG tablet Take 1 tablet by mouth daily. 03/18/15  Yes [provider]  traMADol (ULTRAM) 50 MG tablet Take 50 mg by mouth every 12 (twelve) hours as needed for moderate pain.  01/18/17  Yes [provider]    Family History Family History  Problem Relation Age of Onset  . Lung disease Father     Social History Social History  Substance Use Topics  . Smoking status: Current Every Day Smoker    Packs/day: 1.50    Types: Cigarettes  . Smokeless tobacco: Never Used  . Alcohol use 0.0 oz/week     Comment: weekends mostly     Allergies   Patient has no known allergies.   Review  of Systems Review of Systems  Constitutional: Negative for chills and fever.  HENT: Positive for congestion. Negative for facial swelling.   Eyes: Negative for discharge and visual disturbance.  Respiratory: Positive for cough. Negative for shortness of breath.   Cardiovascular: Positive for chest pain. Negative for palpitations.  Gastrointestinal: Negative for abdominal pain, diarrhea and vomiting.  Musculoskeletal: Negative for arthralgias and myalgias.  Skin: Negative for color change and rash.    Neurological: Positive for headaches. Negative for tremors and syncope.  Psychiatric/Behavioral: Negative for confusion and dysphoric mood.     Physical Exam Updated Vital Signs BP 131/83   Pulse 61   Temp 98.3 F (36.8 C) (Oral)   Ht 6\' 1"  (1.854 m)   Wt 118.8 kg (262 lb)   SpO2 97%   BMI 34.57 kg/m   Physical Exam  Constitutional: He is oriented to person, place, and time. He appears well-developed and well-nourished.  HENT:  Head: Normocephalic and atraumatic.  Swollen turbinates, posterior nasal drip, no noted sinus ttp, tm normal bilaterally.    Eyes: EOM are normal. Pupils are equal, round, and reactive to light.  Neck: Normal range of motion. Neck supple. No JVD present.  Cardiovascular: Normal rate and regular rhythm.  Exam reveals no gallop and no friction rub.   No murmur heard. Pulmonary/Chest: No respiratory distress. He has no wheezes.  Abdominal: He exhibits no distension and no mass. There is no tenderness. There is no rebound and no guarding.  Musculoskeletal: Normal range of motion.  Neurological: He is alert and oriented to person, place, and time.  Skin: No rash noted. No pallor.  Psychiatric: He has a normal mood and affect. His behavior is normal.  Nursing note and vitals reviewed.    ED Treatments / Results  Labs (all labs ordered are listed, but only abnormal results are displayed) Labs Reviewed  BASIC METABOLIC PANEL - Abnormal; Notable for the following:       Result Value   Calcium 8.4 (*)    All other components within normal limits  CBC  I-STAT TROPOININ, ED    EKG  EKG Interpretation  Date/Time:  Tuesday May 30 2017 19:23:24 EDT Ventricular Rate:  67 PR Interval:  138 QRS Duration: 90 QT Interval:  412 QTC Calculation: 435 R Axis:   70 Text Interpretation:  Normal sinus rhythm Possible Left atrial enlargement Borderline ECG No significant change since last tracing Confirmed by Vanetta Mulders [phone]) on 05/30/2017 7:29:03 PM        Radiology Dg Chest 2 View  Result Date: 05/30/2017 CLINICAL DATA:  61 year old with throbbing mid chest pain that began yesterday. EXAM: CHEST  2 VIEW COMPARISON:  01/17/2017, 10/18/2016 and earlier. FINDINGS: Cardiomediastinal silhouette unremarkable, unchanged. Mildly prominent bronchovascular markings diffusely and mild central peribronchial thickening, unchanged. Lungs otherwise clear. No localized airspace consolidation. No pleural effusions. No pneumothorax. Normal pulmonary vascularity. Degenerative changes involving the thoracic and upper lumbar spine. Prior posterior lower cervical fusion. IMPRESSION: Stable mild changes of chronic bronchitis and/or asthma. No acute cardiopulmonary disease. Electronically Signed   By: Hulan Saas M.D.   On: 05/30/2017 20:27    Procedures Procedures (including critical care time)  Medications Ordered in ED Medications - No data to display   Initial Impression / Assessment and Plan / ED Course  I have reviewed the triage vital signs and the nursing notes.  Pertinent labs & imaging results that were available during my care of the patient were reviewed by me and considered in  my medical decision making (see chart for details).     61 yo M With a chief complaints of headache, hypertension, chest pain. Chest pain is completely atypical of ACS. I do not feel that any further workup is required other than the EKG chest x-ray troponin that were done in triage. Patient's headaches sound like tension headaches are posterior in nature along the occiput where the musculature attaches. He is currently not having a headache. No red flags. He is having a bit of URI like symptoms as well. Discussed symptomatic management. His blood pressure is mildly elevated. Recommend he follow with the PCP.  Final Clinical Impressions(s) / ED Diagnoses   Final diagnoses:  Upper respiratory tract infection, unspecified type    New Prescriptions Discharge  Medication List as of 05/30/2017 10:47 PM       Melene Plan, DO 05/30/17 [id number]

## 2017-06-02 ENCOUNTER — Encounter: Payer: Self-pay | Admitting: Neurology

## 2017-06-02 DIAGNOSIS — M1711 Unilateral primary osteoarthritis, right knee: Secondary | ICD-10-CM | POA: Diagnosis not present

## 2017-06-05 DIAGNOSIS — R0789 Other chest pain: Secondary | ICD-10-CM | POA: Diagnosis not present

## 2017-06-05 DIAGNOSIS — Z79899 Other long term (current) drug therapy: Secondary | ICD-10-CM | POA: Diagnosis not present

## 2017-06-05 DIAGNOSIS — Z6833 Body mass index (BMI) 33.0-33.9, adult: Secondary | ICD-10-CM | POA: Diagnosis not present

## 2017-06-15 DIAGNOSIS — M4712 Other spondylosis with myelopathy, cervical region: Secondary | ICD-10-CM | POA: Diagnosis not present

## 2017-06-29 ENCOUNTER — Ambulatory Visit (INDEPENDENT_AMBULATORY_CARE_PROVIDER_SITE_OTHER): Payer: BLUE CROSS/BLUE SHIELD | Admitting: Neurology

## 2017-06-29 ENCOUNTER — Encounter: Payer: Self-pay | Admitting: Neurology

## 2017-06-29 VITALS — BP 108/68 | HR 78 | Ht 73.0 in | Wt 264.0 lb

## 2017-06-29 DIAGNOSIS — M542 Cervicalgia: Secondary | ICD-10-CM | POA: Diagnosis not present

## 2017-06-29 DIAGNOSIS — R51 Headache: Secondary | ICD-10-CM

## 2017-06-29 DIAGNOSIS — G4486 Cervicogenic headache: Secondary | ICD-10-CM

## 2017-06-29 MED ORDER — GABAPENTIN 300 MG PO CAPS: 300.0000 mg | ORAL_CAPSULE | Freq: Three times a day (TID) | ORAL | 3 refills | Status: DC

## 2017-06-29 NOTE — Progress Notes (Signed)
NEUROLOGY CONSULTATION NOTE  Michael Whitaker MRN: [id number] DOB: July 16, 1956  Referring provider: Melene Planan Floyd (ED referral) Primary care provider: Dr. Orvan Falconerampbell  Reason for consult:  headache  HISTORY OF PRESENT ILLNESS: Michael Whitaker is a 61 year old right-handed male with hypertension, hyperlipidemia and GERD who presents for headache.  History supplemented by ED note.  Onset:  In July 2017, he underwent cervical spine surgery due to neck and left sided radicular pain.  Following surgery, he has had neck and posterior head pain. Location:  Occipital region, radiating into neck bilaterally Quality:  throbbing Intensity:  3-4/10 Aura:  no Prodrome:  no Postdrome:  no Associated symptoms:  No nausea, vomiting, photophobia, phonophobia or visual disturbance.  He has not had any new worse headache of his life, or waking up from sleep Duration:  Constantly Frequency:  daily Frequency of abortive medication: daily Triggers/exacerbating factors:  Neck movement Relieving factors:  Tylenol or Advil Activity:  Functions. Limited neck movement due to tightness and pain.  Still has pain radiating down both shoulders.  No arm/hand weakness or numbness.  Past NSAIDS:  no Past analgesics:  tramadol Past abortive triptans:  no Past muscle relaxants:  Yes (does not remember which one but it caused drowsiness)* Other past therapies:  no  Current NSAIDS:  Advil Current analgesics:  Tylenol Current triptans:  no Current anti-emetic:  no Current muscle relaxants:  no Current anti-anxiolytic:  no Current sleep aide:  no Current Antihypertensive medications:  Lisinopril 20mg , metoprolol succinate 100mg  Current Antidepressant medications:  no Current Anticonvulsant medications:  gabapentin 300mg  three times daily as needed (usually taking up to twice daily) Current Vitamins/Herbal/Supplements:  no Current Antihistamines/Decongestants:  no Other therapy:  no  05/30/17 LABS:  CBC with WBC  8.8, HGB 13.7, HCT 42.4, PLT 251; BMP with Na 138, K 3.9, Cl 106, CO2 26, glucose 92, BUN 8 and Cr 0.80.  PAST MEDICAL HISTORY: Past Medical History:  Diagnosis Date  . GERD (gastroesophageal reflux disease)   . Hyperlipemia   . Hypertension     PAST SURGICAL HISTORY: Past Surgical History:  Procedure Laterality Date  . CERVICAL SPINE SURGERY    . ELBOW SURGERY    . UMBILICAL HERNIA REPAIR      MEDICATIONS: Current Outpatient Prescriptions on File Prior to Visit  Medication Sig Dispense Refill  . metoprolol succinate (TOPROL-XL) 100 MG 24 hr tablet Take 1 tablet (100 mg total) by mouth daily. Take with or immediately following a meal. 30 tablet 0  . simvastatin (ZOCOR) 10 MG tablet Take 1 tablet by mouth daily.     No current facility-administered medications on file prior to visit.     ALLERGIES: No Known Allergies  FAMILY HISTORY: Family History  Problem Relation Age of Onset  . Lung disease Father     SOCIAL HISTORY: Social History   Social History  . Marital status: Widowed    Spouse name: N/A  . Number of children: 2  . Years of education: N/A   Occupational History  . Not on file.   Social History Main Topics  . Smoking status: Former Smoker    Packs/day: 1.50    Types: Cigarettes    Quit date: 10/30/2016  . Smokeless tobacco: Never Used  . Alcohol use No     Comment: "not since last year" 06/29/2017"  . Drug use: No  . Sexual activity: Not on file   Other Topics Concern  . Not on file   Social History  Narrative   Lives alone.  Drive a rock truck    REVIEW OF SYSTEMS: Constitutional: No fevers, chills, or sweats, no generalized fatigue, change in appetite Eyes: No visual changes, double vision, eye pain Ear, nose and throat: No hearing loss, ear pain, nasal congestion, sore throat Cardiovascular: No chest pain, palpitations Respiratory:  No shortness of breath at rest or with exertion, wheezes GastrointestinaI: No nausea, vomiting,  diarrhea, abdominal pain, fecal incontinence Genitourinary:  No dysuria, urinary retention or frequency Musculoskeletal:  Neck pain Integumentary: No rash, pruritus, skin lesions Neurological: as above Psychiatric: No depression, insomnia, anxiety Endocrine: No palpitations, fatigue, diaphoresis, mood swings, change in appetite, change in weight, increased thirst Hematologic/Lymphatic:  No purpura, petechiae. Allergic/Immunologic: no itchy/runny eyes, nasal congestion, recent allergic reactions, rashes  PHYSICAL EXAM: Vitals:   06/29/17 0745  BP: 108/68  Pulse: 78   General: No acute distress.  Patient appears well-groomed.  Head:  Normocephalic/atraumatic Eyes:  fundi examined but not visualized Neck: supple, mild paraspinal tenderness, decreased range of motion in all directions. Back: No paraspinal tenderness Heart: regular rate and rhythm Lungs: Clear to auscultation bilaterally. Vascular: No carotid bruits. Neurological Exam: Mental status: alert and oriented to person, place, and time, recent and remote memory intact, fund of knowledge intact, attention and concentration intact, speech fluent and not dysarthric, language intact. Cranial nerves: CN I: not tested CN II: pupils equal, round and reactive to light, visual fields intact CN III, IV, VI:  full range of motion, no nystagmus, no ptosis CN V: facial sensation intact CN VII: upper and lower face symmetric CN VIII: hearing intact CN IX, X: gag intact, uvula midline CN XI: sternocleidomastoid and trapezius muscles intact CN XII: tongue midline Bulk & Tone: normal, no fasciculations. Motor:  5/5 throughout  Sensation: temperature and vibration sensation intact. Deep Tendon Reflexes:  2+ throughout, toes downgoing.  Finger to nose testing:  Without dysmetria.  Heel to shin:  Without dysmetria.  Gait:  Normal station and stride.  Able to turn and tandem walk. Romberg negative.  IMPRESSION: Cervicalgia with  cervicogenic headache  PLAN: 1.  Advised to start taking the gabapentin 300mg  three times daily around the clock. 2.  If pain not better in 2 weeks, he is to contact me and we can increase dose 3.  I will review notes and imaging reports from his spine surgeon. 4.  Follow up in 3 months.  Thank you for allowing me to take part in the care of this patient.  Shon MilletAdam Jaffe, DO  CC:  Junious DresserStephen Campbell, MD

## 2017-06-29 NOTE — Patient Instructions (Signed)
1.  Start taking the gabapentin 300mg  three times daily around the clock. 2.  If pain not better in 2 weeks, contact me and we can increase dose 3.  I will review notes and reports from your surgeon.  Likely, I will send you to physical therapy 4.  Follow up in 3 months.

## 2017-07-06 DIAGNOSIS — E785 Hyperlipidemia, unspecified: Secondary | ICD-10-CM | POA: Diagnosis not present

## 2017-07-06 DIAGNOSIS — I10 Essential (primary) hypertension: Secondary | ICD-10-CM | POA: Diagnosis not present

## 2017-07-06 DIAGNOSIS — Z79899 Other long term (current) drug therapy: Secondary | ICD-10-CM | POA: Diagnosis not present

## 2017-07-06 DIAGNOSIS — Z6833 Body mass index (BMI) 33.0-33.9, adult: Secondary | ICD-10-CM | POA: Diagnosis not present

## 2017-07-10 DIAGNOSIS — R972 Elevated prostate specific antigen [PSA]: Secondary | ICD-10-CM | POA: Diagnosis not present

## 2017-07-10 DIAGNOSIS — N401 Enlarged prostate with lower urinary tract symptoms: Secondary | ICD-10-CM | POA: Diagnosis not present

## 2017-07-17 DIAGNOSIS — R972 Elevated prostate specific antigen [PSA]: Secondary | ICD-10-CM | POA: Diagnosis not present

## 2017-07-17 DIAGNOSIS — N401 Enlarged prostate with lower urinary tract symptoms: Secondary | ICD-10-CM | POA: Diagnosis not present

## 2017-07-18 DIAGNOSIS — M1711 Unilateral primary osteoarthritis, right knee: Secondary | ICD-10-CM | POA: Diagnosis not present

## 2017-08-15 ENCOUNTER — Emergency Department (HOSPITAL_COMMUNITY)
Admission: EM | Admit: 2017-08-15 | Discharge: 2017-08-15 | Disposition: A | Discharge status: Home / Self Care | Payer: BLUE CROSS/BLUE SHIELD | Attending: Emergency Medicine | Admitting: Emergency Medicine

## 2017-08-15 ENCOUNTER — Emergency Department (HOSPITAL_COMMUNITY): Payer: BLUE CROSS/BLUE SHIELD

## 2017-08-15 ENCOUNTER — Encounter (HOSPITAL_COMMUNITY): Payer: Self-pay | Admitting: Emergency Medicine

## 2017-08-15 DIAGNOSIS — R0789 Other chest pain: Secondary | ICD-10-CM | POA: Insufficient documentation

## 2017-08-15 DIAGNOSIS — R079 Chest pain, unspecified: Secondary | ICD-10-CM

## 2017-08-15 DIAGNOSIS — R0602 Shortness of breath: Secondary | ICD-10-CM | POA: Insufficient documentation

## 2017-08-15 DIAGNOSIS — I10 Essential (primary) hypertension: Secondary | ICD-10-CM | POA: Insufficient documentation

## 2017-08-15 DIAGNOSIS — Z79899 Other long term (current) drug therapy: Secondary | ICD-10-CM | POA: Diagnosis not present

## 2017-08-15 DIAGNOSIS — Z87891 Personal history of nicotine dependence: Secondary | ICD-10-CM | POA: Insufficient documentation

## 2017-08-15 DIAGNOSIS — R630 Anorexia: Secondary | ICD-10-CM | POA: Insufficient documentation

## 2017-08-15 LAB — BASIC METABOLIC PANEL
Anion gap: 4 — ABNORMAL LOW (ref 5–15)
BUN: 10 mg/dL (ref 6–20)
CALCIUM: 8.3 mg/dL — AB (ref 8.9–10.3)
CHLORIDE: 106 mmol/L (ref 101–111)
CO2: 26 mmol/L (ref 22–32)
CREATININE: 0.83 mg/dL (ref 0.61–1.24)
GFR calc non Af Amer: >60 mL/min (ref >60)
Glucose, Bld: 82 mg/dL (ref 65–99)
Potassium: 5.5 mmol/L — ABNORMAL HIGH (ref 3.5–5.1)
SODIUM: 136 mmol/L (ref 135–145)

## 2017-08-15 LAB — CBC
HCT: 42.6 % (ref 39.0–52.0)
Hemoglobin: 13.9 g/dL (ref 13.0–17.0)
MCH: 30.8 pg (ref 26.0–34.0)
MCHC: 32.6 g/dL (ref 30.0–36.0)
MCV: 94.5 fL (ref 78.0–100.0)
Platelets: 227 10*3/uL (ref 150–400)
RBC: 4.51 MIL/uL (ref 4.22–5.81)
RDW: 13.1 % (ref 11.5–15.5)
WBC: 7.9 10*3/uL (ref 4.0–10.5)

## 2017-08-15 LAB — I-STAT TROPONIN, ED: TROPONIN I, POC: 0 ng/mL (ref 0.00–0.08)

## 2017-08-15 NOTE — ED Provider Notes (Signed)
[facility] Provider Note   CSN: [id number] Arrival date & time: 08/15/17  1819     History   Chief Complaint Chief Complaint  Patient presents with  . Chest Pain    HPI Michael Whitaker is a 61 y.o. male.  HPI Patient with chest pain. Has had for last couple days but has been on off for the last year. Previously had A left-sided now more on the right side. Not worse with shortness of breath. Worse with certain movements. No diaphoresis. Has had previous reassuring stress test. no shortness of breath or diaphoresis. States that he took a Neurontin and ibuprofen for the pain. He also takes these for his neck pain. States he's been taking a lot of ibuprofen recently. Past Medical History:  Diagnosis Date  . GERD (gastroesophageal reflux disease)   . Hyperlipemia   . Hypertension     Patient Active Problem List   Diagnosis Date Noted  . HTN (hypertension) 03/26/2015  . Tobacco abuse 03/26/2015  . Trigger finger, acquired 01/27/2015  . Fracture of fifth metacarpal bone of left hand 01/27/2015    Past Surgical History:  Procedure Laterality Date  . CERVICAL SPINE SURGERY    . ELBOW SURGERY    . UMBILICAL HERNIA REPAIR         Home Medications    Prior to Admission medications   Medication Sig Start Date End Date Taking? Authorizing Provider  gabapentin (NEURONTIN) 300 MG capsule Take 1 capsule (300 mg total) by mouth 3 (three) times daily. 06/29/17  Yes Jaffe, Adam R, DO  ibuprofen (ADVIL,MOTRIN) 200 MG tablet Take 200 mg by mouth every 6 (six) hours as needed for mild pain.    Yes [provider]  lisinopril (PRINIVIL,ZESTRIL) 40 MG tablet Take 40 mg by mouth daily. 06/10/17  Yes [provider]  metoprolol succinate (TOPROL-XL) 100 MG 24 hr tablet Take 1 tablet (100 mg total) by mouth daily. Take with or immediately following a meal. 01/19/15  Yes Gerhard Munch, MD  simvastatin (ZOCOR) 10 MG tablet Take 10 mg by mouth daily.  03/18/15  Yes  [provider]  tamsulosin (FLOMAX) 0.4 MG CAPS capsule Take 0.4 mg by mouth daily after supper. 08/10/17  Yes [provider]    Family History Family History  Problem Relation Age of Onset  . Lung disease Father     Social History Social History  Substance Use Topics  . Smoking status: Former Smoker    Packs/day: 1.50    Types: Cigarettes    Quit date: 10/30/2016  . Smokeless tobacco: Never Used  . Alcohol use No     Comment: "not since last year" 06/29/2017"     Allergies   Patient has no known allergies.   Review of Systems Review of Systems  Constitutional: Positive for appetite change.  HENT: Negative for congestion.   Respiratory: Negative for shortness of breath.   Cardiovascular: Positive for chest pain.  Gastrointestinal: Negative for abdominal distention.  Musculoskeletal: Negative for back pain.  Hematological: Negative for adenopathy.  Psychiatric/Behavioral: Negative for agitation.     Physical Exam Updated Vital Signs BP (!) 148/96 (BP Location: Right Arm)   Pulse 62   Temp 97.9 F (36.6 C) (Oral)   Resp 18   Ht  (1.854 m)   Wt 120.2 kg (265 lb)   SpO2 99%   BMI 34.96 kg/m   Physical Exam   ED Treatments / Results  Labs (all labs ordered are listed,  but only abnormal results are displayed) Labs Reviewed  BASIC METABOLIC PANEL - Abnormal; Notable for the following:       Result Value   Potassium 5.5 (*)    Calcium 8.3 (*)    Anion gap 4 (*)    All other components within normal limits  CBC  I-STAT TROPONIN, ED    EKG  EKG Interpretation  Date/Time:  Tuesday August 15 2017 18:32:42 EDT Ventricular Rate:  65 PR Interval:    QRS Duration: 96 QT Interval:  418 QTC Calculation: 435 R Axis:   50 Text Interpretation:  Sinus rhythm Probable left atrial enlargement Borderline ST elevation, anterior leads nsclt Confirmed by Benjiman CorePickering, Doralene Glanz [phone]) on 08/15/2017 6:34:49 PM       Radiology Dg Chest 2  View  Result Date: 08/15/2017 CLINICAL DATA:  Right-sided chest pain with shortness of Breath EXAM: CHEST  2 VIEW COMPARISON:  05/30/2017 FINDINGS: The heart size and mediastinal contours are within normal limits. Both lungs are clear. The visualized skeletal structures are unremarkable. IMPRESSION: No active cardiopulmonary disease. Electronically Signed   By: Alcide CleverMark  Lukens M.D.   On: 08/15/2017 19:08    Procedures Procedures (including critical care time)  Medications Ordered in ED Medications - No data to display   Initial Impression / Assessment and Plan / ED Course  I have reviewed the triage vital signs and the nursing notes.  Pertinent labs & imaging results that were available during my care of the patient were reviewed by me and considered in my medical decision making (see chart for details).     Patient with chest pain. Somewhat reproducible but also has been taking NSAIDs and could be more esophageal. Will cut back on the NSAIDs. EKG reassuring. Troponin negative. Reviewed previous stress test results. Discharge home.  Final Clinical Impressions(s) / ED Diagnoses   Final diagnoses:  Nonspecific chest pain    New Prescriptions New Prescriptions   No medications on file     Benjiman CorePickering, Cayden Rautio, MD 08/16/17 [phone]

## 2017-08-15 NOTE — ED Triage Notes (Signed)
Per EMS, pt c/o right sided chest pain that has been intermittent x 1 year. Pt denies shortness of breath/nausea/vomiting, pain does not radiate. Pt describes pain as "soreness". Given 324 aspirin PTA. EMS vitals: BP-160/100

## 2017-08-17 DIAGNOSIS — R0789 Other chest pain: Secondary | ICD-10-CM | POA: Diagnosis not present

## 2017-08-17 DIAGNOSIS — Z79899 Other long term (current) drug therapy: Secondary | ICD-10-CM | POA: Diagnosis not present

## 2017-08-17 DIAGNOSIS — Z6833 Body mass index (BMI) 33.0-33.9, adult: Secondary | ICD-10-CM | POA: Diagnosis not present

## 2017-08-18 DIAGNOSIS — M4712 Other spondylosis with myelopathy, cervical region: Secondary | ICD-10-CM | POA: Diagnosis not present

## 2017-09-04 DIAGNOSIS — M4802 Spinal stenosis, cervical region: Secondary | ICD-10-CM | POA: Diagnosis not present

## 2017-09-04 DIAGNOSIS — M542 Cervicalgia: Secondary | ICD-10-CM | POA: Diagnosis not present

## 2017-09-04 DIAGNOSIS — M4712 Other spondylosis with myelopathy, cervical region: Secondary | ICD-10-CM | POA: Diagnosis not present

## 2017-09-05 DIAGNOSIS — M4712 Other spondylosis with myelopathy, cervical region: Secondary | ICD-10-CM | POA: Diagnosis not present

## 2017-09-07 DIAGNOSIS — Z6834 Body mass index (BMI) 34.0-34.9, adult: Secondary | ICD-10-CM | POA: Diagnosis not present

## 2017-09-07 DIAGNOSIS — M159 Polyosteoarthritis, unspecified: Secondary | ICD-10-CM | POA: Diagnosis not present

## 2017-09-07 DIAGNOSIS — R0789 Other chest pain: Secondary | ICD-10-CM | POA: Diagnosis not present

## 2017-09-07 DIAGNOSIS — K219 Gastro-esophageal reflux disease without esophagitis: Secondary | ICD-10-CM | POA: Diagnosis not present

## 2017-09-16 DIAGNOSIS — H04123 Dry eye syndrome of bilateral lacrimal glands: Secondary | ICD-10-CM | POA: Diagnosis not present

## 2017-09-29 ENCOUNTER — Ambulatory Visit (INDEPENDENT_AMBULATORY_CARE_PROVIDER_SITE_OTHER): Payer: BLUE CROSS/BLUE SHIELD | Admitting: Neurology

## 2017-09-29 ENCOUNTER — Encounter: Payer: Self-pay | Admitting: Neurology

## 2017-09-29 VITALS — BP 140/90 | HR 78 | Ht 73.0 in | Wt 260.4 lb

## 2017-09-29 DIAGNOSIS — I10 Essential (primary) hypertension: Secondary | ICD-10-CM | POA: Diagnosis not present

## 2017-09-29 DIAGNOSIS — G4486 Cervicogenic headache: Secondary | ICD-10-CM

## 2017-09-29 DIAGNOSIS — M542 Cervicalgia: Secondary | ICD-10-CM

## 2017-09-29 DIAGNOSIS — R51 Headache: Secondary | ICD-10-CM

## 2017-09-29 MED ORDER — TIZANIDINE HCL 4 MG PO TABS: ORAL_TABLET | ORAL | 2 refills | Status: DC

## 2017-09-29 NOTE — Patient Instructions (Signed)
1.  Continue gabapentin 300mg  three times daily 2.  Start tizanidine 4mg .  Take 1/2 tablet to 1 tablet at bedtime.  Contact me in 2 weeks.  If neck pain and headaches not improved, we can increase gabapentin 3.  Follow up in 3 months.

## 2017-09-29 NOTE — Progress Notes (Signed)
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  UPDATE: Headaches daily but not constant.  Still with significant neck pain Intensity:  moderate Duration:  3-5 minutes off and on daily Frequency:  daily Frequency of abortive medication: daily Current NSAIDS:  Celecoxib 200mg  twice daily PRN Current analgesics:  no Current triptans:  no Current anti-emetic:  no Current muscle relaxants:  no Current anti-anxiolytic:  no Current sleep aide:  no Current Antihypertensive medications:  Lisinopril 20mg , metoprolol succinate 100mg  Current Antidepressant medications:  no Current Anticonvulsant medications:  gabapentin 300mg  three times daily Current Vitamins/Herbal/Supplements:  no Current Antihistamines/Decongestants:  no Other therapy:  no  HISTORY:  Onset:  In 2017, he developed left sided radicular neck pain.  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  In July 2017, he underwent cervical laminectomy with posterior spinal fusion a C3-4, C4-5 and C5-6 due to neck and left sided radicular pain.  Following surgery, he has had neck and posterior head pain.  Cervical X-rays on 10/14/16 demonstrated adequate hardware without evidence of loosening. Location:  Occipital region, radiating into neck bilaterally Quality:  throbbing Initial Intensity:  3-4/10 Aura:  no Prodrome:  no Postdrome:  no Associated symptoms:  No nausea, vomiting, photophobia, phonophobia or visual disturbance.  He has not had any new worse headache of his life, or waking up from sleep Initial Duration:  Constantly Initial Frequency:  daily Initial Frequency  of abortive medication: daily Triggers/exacerbating factors:  Neck movement Relieving factors:  Tylenol or Advil Activity:  Functions. Limited neck movement due to tightness and pain.  Still has pain radiating down both shoulders.  No arm/hand weakness or numbness.   Past NSAIDS:  Advil Past analgesics:  Tramadol, Tylenol Past abortive triptans:  no Past muscle relaxants:  Yes (does not remember which one but it caused drowsiness)* Other past therapies:  no  PAST MEDICAL HISTORY: Past Medical History:  Diagnosis Date  . GERD (gastroesophageal reflux disease)   . Hyperlipemia   . Hypertension     MEDICATIONS: Current Outpatient Prescriptions on File Prior to Visit  Medication Sig Dispense Refill  . gabapentin (NEURONTIN) 300 MG capsule Take 1 capsule (300 mg total) by mouth 3 (three) times daily. 90 capsule 3  . ibuprofen (ADVIL,MOTRIN) 200 MG tablet Take 200 mg by mouth every 6 (six) hours as needed for mild pain.     Marland Kitchen lisinopril (PRINIVIL,ZESTRIL) 40 MG tablet Take 40 mg by mouth daily.    . metoprolol succinate (TOPROL-XL) 100 MG 24 hr tablet Take 1 tablet (100 mg total) by mouth daily. Take with or immediately following a meal. 30 tablet 0  . simvastatin (ZOCOR) 10 MG tablet Take 10 mg by mouth daily.     . tamsulosin (FLOMAX) 0.4 MG CAPS capsule Take 0.4 mg by mouth daily after supper.     No current facility-administered medications on file prior to visit.     ALLERGIES: No Known Allergies  FAMILY HISTORY: Family History  Problem Relation Age of Onset  . Lung disease Father     SOCIAL HISTORY: Social History   Social History  . Marital status: Widowed    Spouse name: N/A  . Number of  children: 2  . Years of education: N/A   Occupational History  . Not on file.   Social History Main Topics  . Smoking status: Former Smoker    Packs/day: 1.50    Types: Cigarettes    Quit date: 10/30/2016  . Smokeless tobacco: Never Used  . Alcohol use No     Comment:  "not since last year" 06/29/2017"  . Drug use: No  . Sexual activity: Not on file   Other Topics Concern  . 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    REVIEW OF SYSTEMS: Constitutional: No fevers, chills, or sweats, no generalized fatigue, change in appetite Eyes: No visual changes, double vision, eye pain Ear, nose and throat: No hearing loss, ear pain, nasal congestion, sore throat Cardiovascular: No chest pain, palpitations Respiratory:  No shortness of breath at rest or with exertion, wheezes GastrointestinaI: No nausea, vomiting, diarrhea, abdominal pain, fecal incontinence Genitourinary:  No dysuria, urinary retention or frequency Musculoskeletal:  Neck pain, musculoskeletal upper chest pain bilaterally Integumentary: No rash, pruritus, skin lesions Neurological: as above Psychiatric: No depression, insomnia, anxiety Endocrine: No palpitations, fatigue, diaphoresis, mood swings, change in appetite, change in weight, increased thirst Hematologic/Lymphatic:  No purpura, petechiae. Allergic/Immunologic: no itchy/runny eyes, nasal congestion, recent allergic reactions, rashes  PHYSICAL EXAM: Vitals:   09/29/17 0811  BP: 140/90  Pulse: 78  SpO2: 98%   General: No acute distress.  Patient appears well-groomed.   Head:  Normocephalic/atraumatic Eyes:  Fundi examined but not visualized Neck: supple, bilateral paraspinal tenderness, full range of motion Heart:  Regular rate and rhythm Lungs:  Clear to auscultation bilaterally Back: No paraspinal tenderness Neurological Exam: alert and oriented to person, place, and time. Attention span and concentration intact, recent and remote memory intact, fund of knowledge intact.  Speech fluent and not dysarthric, language intact.  CN II-XII intact. Bulk and tone normal, muscle strength 5/5 throughout.  Sensation to light touch  intact.  Deep tendon reflexes 2+ throughout.  Finger to nose testing intact.   Gait normal  IMPRESSION: Cervicogenic headache and cervicalgia HTN with elevated blood pressure  PLAN: 1.  Continue gabapentin 300mg  three times daily 2.  Start tizanidine 4mg .  Take 1/2 tablet to 1 tablet at bedtime.  Contact me in 2 weeks.  If neck pain and headaches not improved, we can increase gabapentin, titrating to 600mg  three times daily 3.  Should follow up with PCP regarding blood pressure 4.  Follow up in 3 months.  If not improved, consider referral to pain management for neck pain.  Shon MilletAdam Jaffe, DO  CC:  Junious DresserStephen Campbell, MD

## 2017-11-06 DIAGNOSIS — I10 Essential (primary) hypertension: Secondary | ICD-10-CM | POA: Diagnosis not present

## 2017-11-06 DIAGNOSIS — Z125 Encounter for screening for malignant neoplasm of prostate: Secondary | ICD-10-CM | POA: Diagnosis not present

## 2017-11-06 DIAGNOSIS — M159 Polyosteoarthritis, unspecified: Secondary | ICD-10-CM | POA: Diagnosis not present

## 2017-11-06 DIAGNOSIS — K219 Gastro-esophageal reflux disease without esophagitis: Secondary | ICD-10-CM | POA: Diagnosis not present

## 2017-11-06 DIAGNOSIS — E785 Hyperlipidemia, unspecified: Secondary | ICD-10-CM | POA: Diagnosis not present

## 2017-11-14 DIAGNOSIS — N401 Enlarged prostate with lower urinary tract symptoms: Secondary | ICD-10-CM | POA: Diagnosis not present

## 2017-11-14 DIAGNOSIS — R972 Elevated prostate specific antigen [PSA]: Secondary | ICD-10-CM | POA: Diagnosis not present

## 2017-12-05 ENCOUNTER — Emergency Department (HOSPITAL_COMMUNITY)
Admission: EM | Admit: 2017-12-05 | Discharge: 2017-12-05 | Disposition: A | Discharge status: Home / Self Care | Payer: BLUE CROSS/BLUE SHIELD | Attending: Emergency Medicine | Admitting: Emergency Medicine

## 2017-12-05 ENCOUNTER — Encounter (HOSPITAL_COMMUNITY): Payer: Self-pay | Admitting: Emergency Medicine

## 2017-12-05 ENCOUNTER — Other Ambulatory Visit: Payer: Self-pay

## 2017-12-05 DIAGNOSIS — I10 Essential (primary) hypertension: Secondary | ICD-10-CM | POA: Diagnosis not present

## 2017-12-05 DIAGNOSIS — Y998 Other external cause status: Secondary | ICD-10-CM | POA: Insufficient documentation

## 2017-12-05 DIAGNOSIS — M795 Residual foreign body in soft tissue: Secondary | ICD-10-CM

## 2017-12-05 DIAGNOSIS — Z79899 Other long term (current) drug therapy: Secondary | ICD-10-CM | POA: Diagnosis not present

## 2017-12-05 DIAGNOSIS — Y93E8 Activity, other personal hygiene: Secondary | ICD-10-CM | POA: Diagnosis not present

## 2017-12-05 DIAGNOSIS — Y929 Unspecified place or not applicable: Secondary | ICD-10-CM | POA: Diagnosis not present

## 2017-12-05 DIAGNOSIS — Y29XXXA Contact with blunt object, undetermined intent, initial encounter: Secondary | ICD-10-CM | POA: Insufficient documentation

## 2017-12-05 DIAGNOSIS — Z87891 Personal history of nicotine dependence: Secondary | ICD-10-CM | POA: Insufficient documentation

## 2017-12-05 DIAGNOSIS — T162XXA Foreign body in left ear, initial encounter: Secondary | ICD-10-CM | POA: Insufficient documentation

## 2017-12-05 DIAGNOSIS — H9202 Otalgia, left ear: Secondary | ICD-10-CM | POA: Diagnosis not present

## 2017-12-05 MED ORDER — CIPROFLOXACIN-DEXAMETHASONE 0.3-0.1 % OT SUSP: 4.0000 [drp] | Freq: Two times a day (BID) | OTIC | 0 refills | Status: DC

## 2017-12-05 NOTE — ED Triage Notes (Signed)
Pt. reported a piece of cotton stuck in his left ear while using a cotton tipped stick this evening , no bleeding .

## 2017-12-05 NOTE — ED Provider Notes (Addendum)
[facility] EMERGENCY [facility] Provider Note   CSN: [id number] Arrival date & time: 12/05/17  0035     History   Chief Complaint Chief Complaint  Patient presents with  . Cotton stuck inside left ear    HPI Becky Augustadward J Joye is a 62 y.o. male.  The history is provided by the patient.  Foreign Body in Ear  This is a new problem. The current episode started 6 to 12 hours ago. The problem occurs constantly. The problem has not changed since onset.Pertinent negatives include no chest pain, no abdominal pain, no headaches and no shortness of breath. Nothing aggravates the symptoms. Nothing relieves the symptoms. He has tried nothing for the symptoms. The treatment provided no relief.  Qtip in the left ear since tonight.    Past Medical History:  Diagnosis Date  . GERD (gastroesophageal reflux disease)   . Hyperlipemia   . Hypertension     Patient Active Problem List   Diagnosis Date Noted  . HTN (hypertension) 03/26/2015  . Tobacco abuse 03/26/2015  . Trigger finger, acquired 01/27/2015  . Fracture of fifth metacarpal bone of left hand 01/27/2015    Past Surgical History:  Procedure Laterality Date  . CERVICAL SPINE SURGERY    . ELBOW SURGERY    . UMBILICAL HERNIA REPAIR         Home Medications    Prior to Admission medications   Medication Sig Start Date End Date Taking? Authorizing Provider  gabapentin (NEURONTIN) 300 MG capsule Take 1 capsule (300 mg total) by mouth 3 (three) times daily. 06/29/17   Drema DallasJaffe, Adam R, DO  ibuprofen (ADVIL,MOTRIN) 200 MG tablet Take 200 mg by mouth every 6 (six) hours as needed for mild pain.     [provider]  lisinopril (PRINIVIL,ZESTRIL) 40 MG tablet Take 40 mg by mouth daily. 06/10/17   [provider]  metoprolol succinate (TOPROL-XL) 100 MG 24 hr tablet Take 1 tablet (100 mg total) by mouth daily. Take with or immediately following a meal. 01/19/15   Gerhard MunchLockwood, Robert, MD  simvastatin (ZOCOR) 10  MG tablet Take 10 mg by mouth daily.  03/18/15   [provider]  tamsulosin (FLOMAX) 0.4 MG CAPS capsule Take 0.4 mg by mouth daily after supper. 08/10/17   [provider]  tiZANidine (ZANAFLEX) 4 MG tablet Take 0.5 to 1 tablet at bedtime 09/29/17   Drema DallasJaffe, Adam R, DO    Family History Family History  Problem Relation Age of Onset  . Lung disease Father     Social History Social History   Tobacco Use  . Smoking status: Former Smoker    Packs/day: 1.50    Types: Cigarettes    Last attempt to quit: 10/30/2016    Years since quitting: 1.0  . Smokeless tobacco: Never Used  Substance Use Topics  . Alcohol use: No    Alcohol/week: 0.0 oz    Comment: "not since last year" 06/29/2017"  . Drug use: No     Allergies   Patient has no known allergies.   Review of Systems Review of Systems  HENT:       Foreign body in the left ear   Respiratory: Negative for shortness of breath.   Cardiovascular: Negative for chest pain.  Gastrointestinal: Negative for abdominal pain.  Neurological: Negative for headaches.  All other systems reviewed and are negative.    Physical Exam Updated Vital Signs BP 128/78 (BP Location: Right Arm)   Pulse 64  Temp 97.8 F (36.6 C) (Oral)   Resp 17   SpO2 97%   Physical Exam  Constitutional: He is oriented to person, place, and time. He appears well-developed and well-nourished. No distress.  HENT:  Head: Atraumatic.  Right Ear: External ear normal.  Nose: Nose normal.  Mouth/Throat: Oropharynx is clear and moist. No oropharyngeal exudate.  qtip in wax in the left ear  Eyes: Conjunctivae are normal. Pupils are equal, round, and reactive to light.  Neck: Normal range of motion. Neck supple.  Cardiovascular: Normal rate, regular rhythm, normal heart sounds and intact distal pulses.  Pulmonary/Chest: Effort normal. No stridor. He has no wheezes. He has no rales.  Abdominal: Soft. Bowel sounds are normal. There is no tenderness.   Musculoskeletal: Normal range of motion.  Neurological: He is alert and oriented to person, place, and time.  Skin: Skin is warm and dry. Capillary refill takes less than 2 seconds.     ED Treatments / Results   Vitals:   12/05/17 0111 12/05/17 0628  BP: 125/73 128/78  Pulse: 67 64  Resp: 18 17  Temp: 97.8 F (36.6 C) 97.8 F (36.6 C)  SpO2: 100% 97%    Radiology No results found.  Procedures .Foreign Body Removal Date/Time: 12/05/2017 6:47 AM Performed by: Cy Blamer, MD Authorized by: Cy Blamer, MD  Consent: Verbal consent obtained. Written consent not obtained. Risks and benefits: risks, benefits and alternatives were discussed Consent given by: patient Patient identity confirmed: arm band Intake: ear canal.  Anesthesia: Local anesthetic: none.  Sedation: Patient sedated: no  Patient restrained: no Patient cooperative: yes Complexity: complex 1 objects recovered. Objects recovered: head of a Qtip Post-procedure assessment: foreign body removed Patient tolerance: Patient tolerated the procedure well with no immediate complications Comments: Scarring of the left TM, will start drops and refer to ENT   (including critical care time)  Medications Ordered in ED   Ear flushed in the ED>     Final Clinical Impressions(s) / ED Diagnoses   Exam is benign and reassuring.  Patient has now had 2 negative strep tests. Symptoms are clearly viral in nature and no not require antibiotics. Explained to mom that this is a virus and symptoms may last another 5 or so days and to alternate tylenol and ibuprofen for fever control.  Dosage sheet provided.    Return for worsening pain,  fevers > 100.4 unrelieved by medication, weakness or numbness,  intractable vomiting, or diarrhea, abdominal pain, Inability to tolerate liquids or food, cough, altered mental status or any concerns. No signs of systemic illness or infection. The patient is nontoxic-appearing on exam  and vital signs are within normal limits.    I have reviewed the triage vital signs and the nursing notes. Pertinent labs &imaging results that were available during my care of the patient were reviewed by me and considered in my medical decision making (see chart for details).  After history, exam, and medical workup I feel the patient has been appropriately medically screened and is safe for discharge home. Pertinent diagnoses were discussed with the patient. Patient was given return precautions      Graciemae Delisle, MD 12/05/17 1610    Cy Blamer, MD 12/05/17 [id number]    Cy Blamer, MD 12/05/17 5409

## 2018-01-02 ENCOUNTER — Encounter: Payer: Self-pay | Admitting: Neurology

## 2018-01-02 ENCOUNTER — Ambulatory Visit (INDEPENDENT_AMBULATORY_CARE_PROVIDER_SITE_OTHER): Payer: BLUE CROSS/BLUE SHIELD | Admitting: Neurology

## 2018-01-02 VITALS — BP 144/96 | HR 75 | Ht 73.0 in | Wt 274.2 lb

## 2018-01-02 DIAGNOSIS — I10 Essential (primary) hypertension: Secondary | ICD-10-CM

## 2018-01-02 DIAGNOSIS — M542 Cervicalgia: Secondary | ICD-10-CM

## 2018-01-02 DIAGNOSIS — G4486 Cervicogenic headache: Secondary | ICD-10-CM

## 2018-01-02 DIAGNOSIS — R51 Headache: Secondary | ICD-10-CM | POA: Diagnosis not present

## 2018-01-02 NOTE — Progress Notes (Signed)
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   UPDATE: Headaches and neck pain are manageable. Intensity:  moderate Duration:  3-5 minutes off and on daily.  Sometimes he is pain-free for several hours. Frequency:  daily Frequency of abortive medication: daily Current NSAIDS:  Celecoxib 200mg  twice daily PRN Current analgesics:  no Current triptans:  no Current anti-emetic:  no Current muscle relaxants: no.  He never picked up the tizanidine. Current anti-anxiolytic:  no Current sleep aide:  no Current Antihypertensive medications:  Lisinopril 20mg , metoprolol succinate 100mg  Current Antidepressant medications:  no Current Anticonvulsant medications:  gabapentin 300mg  three times daily Current Vitamins/Herbal/Supplements:  no Current Antihistamines/Decongestants:  no Other therapy:  no   HISTORY:  Onset:  In 2017, he developed left sided radicular neck pain.  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  In July 2017, he underwent cervical laminectomy with posterior spinal fusion a C3-4, C4-5 and C5-6 due to neck and left sided radicular pain.  Following surgery, he has had neck and posterior head pain.  Cervical X-rays on 10/14/16 demonstrated adequate hardware without evidence of loosening. Location:  Occipital region, radiating into neck bilaterally Quality:  throbbing Initial Intensity:  3-4/10 Aura:  no Prodrome:  no Postdrome:  no Associated symptoms:  No nausea, vomiting, photophobia, phonophobia or visual disturbance.  He has not had any new worse headache of his life, or waking up from sleep Initial Duration:   Constantly Initial Frequency:  daily Initial Frequency of abortive medication: daily Triggers/exacerbating factors:  Neck movement Relieving factors:  Tylenol or Advil Activity:  Functions. Limited neck movement due to tightness and pain.  Still has pain radiating down both shoulders.  No arm/hand weakness or numbness.   Past NSAIDS:  Advil Past analgesics:  Tramadol, Tylenol Past abortive triptans:  no Past muscle relaxants:  Yes (does not remember which one but it caused drowsiness)* Other past therapies:  no  PAST MEDICAL HISTORY: Past Medical History:  Diagnosis Date  . GERD (gastroesophageal reflux disease)   . Hyperlipemia   . Hypertension     MEDICATIONS: Current Outpatient Medications on File Prior to Visit  Medication Sig Dispense Refill  . celecoxib (CELEBREX) 200 MG capsule Take 200 capsules by mouth 2 (two) times daily as needed.    . ciprofloxacin-dexamethasone (CIPRODEX) OTIC suspension Place 4 drops into the left ear 2 (two) times daily. (Patient not taking: Reported on 01/02/2018) 7.5 mL 0  . gabapentin (NEURONTIN) 300 MG capsule Take 1 capsule (300 mg total) by mouth 3 (three) times daily. 90 capsule 3  . ibuprofen (ADVIL,MOTRIN) 200 MG tablet Take 200 mg by mouth every 6 (six) hours as needed for mild pain.     Marland Kitchen. lisinopril (PRINIVIL,ZESTRIL) 40 MG tablet Take 40 mg by mouth daily.    . metoprolol succinate (TOPROL-XL) 100 MG 24 hr tablet Take 1 tablet (100 mg total) by mouth daily. Take with or immediately following a meal. 30 tablet 0  . simvastatin (ZOCOR) 10 MG tablet Take 10 mg by mouth daily.     . tamsulosin (FLOMAX) 0.4 MG CAPS capsule Take 0.4 mg by mouth daily after supper.    Marland Kitchen. tiZANidine (ZANAFLEX) 4  MG tablet Take 0.5 to 1 tablet at bedtime (Patient not taking: Reported on 01/02/2018) 30 tablet 2   No current facility-administered medications on file prior to visit.     ALLERGIES: No Known Allergies  FAMILY HISTORY: Family History  Problem  Relation Age of Onset  . Lung disease Father     SOCIAL HISTORY: Social History   Socioeconomic History  . Marital status: Widowed    Spouse name: Not on file  . Number of children: 2  . Years of education: Not on file  . Highest education level: Not on file  Social Needs  . Financial resource strain: Not on file  . Food insecurity - worry: Not on file  . Food insecurity - inability: Not on file  . Transportation needs - medical: Not on file  . Transportation needs - non-medical: Not on file  Occupational History  . Not on file  Tobacco Use  . Smoking status: Former Smoker    Packs/day: 1.50    Types: Cigarettes    Last attempt to quit: 10/30/2016    Years since quitting: 1.1  . Smokeless tobacco: Never Used  Substance and Sexual Activity  . Alcohol use: No    Alcohol/week: 0.0 oz    Comment: "not since last year" 06/29/2017"  . Drug use: No  . Sexual activity: Not on file  Other Topics Concern  . 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    REVIEW OF SYSTEMS: Constitutional: No fevers, chills, or sweats, no generalized fatigue, change in appetite Eyes: No visual changes, double vision, eye pain Ear, nose and throat: No hearing loss, ear pain, nasal congestion, sore throat Cardiovascular: No chest pain, palpitations Respiratory:  No shortness of breath at rest or with exertion, wheezes GastrointestinaI: No nausea, vomiting, diarrhea, abdominal pain, fecal incontinence Genitourinary:  No dysuria, urinary retention or frequency Musculoskeletal:  Neck pain Integumentary: No rash, pruritus, skin lesions Neurological: as above Psychiatric: No depression, insomnia, anxiety Endocrine: No palpitations, fatigue, diaphoresis, mood swings, change in appetite, change in weight, increased thirst Hematologic/Lymphatic:  No purpura, petechiae. Allergic/Immunologic: no itchy/runny eyes, nasal congestion, recent allergic reactions, rashes  PHYSICAL  EXAM: Vitals:   01/02/18 0821  BP: (!) 144/96  Pulse: 75  SpO2: 98%   General: No acute distress.  Patient appears well-groomed.   Head:  Normocephalic/atraumatic Eyes:  Fundi examined but not visualized Neck: supple, no paraspinal tenderness, decreased range of motion Heart:  Regular rate and rhythm Lungs:  Clear to auscultation bilaterally Back: No paraspinal tenderness Neurological Exam: alert and oriented to person, place, and time. Attention span and concentration intact, recent and remote memory intact, fund of knowledge intact.  Speech fluent and not dysarthric, language intact.  CN II-XII intact. Bulk and tone normal, muscle strength 5/5 throughout.  Sensation to light touch  intact.  Deep tendon reflexes 2+ throughout.  Finger to nose testing intact.  Gait normal  IMPRESSION: Cervicogenic headache Cervicalgia Hypertension  PLAN: 1.  At this time, he would like to remain on gabapentin 300mg  three times daily. We can increase dose if he desires. 2.  He will take celecoxib as needed (as prescribed by one of his other doctors). 3.  Follow up blood pressure with PCP 4.  Follow up in 4 months.  Shon Millet, DO  CC: Junious Dresser, MD

## 2018-01-02 NOTE — Patient Instructions (Signed)
1.  Continue gabapentin 300mg  three times daily.  Contact me if you would like to increase dose 2.  Follow up in 4 months.

## 2018-01-04 ENCOUNTER — Telehealth: Payer: Self-pay | Admitting: Neurology

## 2018-01-04 MED ORDER — GABAPENTIN 300 MG PO CAPS: 300.0000 mg | ORAL_CAPSULE | Freq: Three times a day (TID) | ORAL | 6 refills | Status: DC

## 2018-01-04 NOTE — Telephone Encounter (Signed)
Pt stopped by and would like his gabapentin faxed to the pharmacy [facility] [address], he said he was out

## 2018-01-23 DIAGNOSIS — Z6835 Body mass index (BMI) 35.0-35.9, adult: Secondary | ICD-10-CM | POA: Diagnosis not present

## 2018-01-23 DIAGNOSIS — I10 Essential (primary) hypertension: Secondary | ICD-10-CM | POA: Diagnosis not present

## 2018-01-23 DIAGNOSIS — H6122 Impacted cerumen, left ear: Secondary | ICD-10-CM | POA: Diagnosis not present

## 2018-01-23 DIAGNOSIS — Z79899 Other long term (current) drug therapy: Secondary | ICD-10-CM | POA: Diagnosis not present

## 2018-03-08 DIAGNOSIS — E785 Hyperlipidemia, unspecified: Secondary | ICD-10-CM | POA: Diagnosis not present

## 2018-03-08 DIAGNOSIS — K219 Gastro-esophageal reflux disease without esophagitis: Secondary | ICD-10-CM | POA: Diagnosis not present

## 2018-03-08 DIAGNOSIS — I10 Essential (primary) hypertension: Secondary | ICD-10-CM | POA: Diagnosis not present

## 2018-03-08 DIAGNOSIS — Z6835 Body mass index (BMI) 35.0-35.9, adult: Secondary | ICD-10-CM | POA: Diagnosis not present

## 2018-04-05 DIAGNOSIS — R609 Edema, unspecified: Secondary | ICD-10-CM | POA: Diagnosis not present

## 2018-04-05 DIAGNOSIS — Z1331 Encounter for screening for depression: Secondary | ICD-10-CM | POA: Diagnosis not present

## 2018-04-05 DIAGNOSIS — Z6835 Body mass index (BMI) 35.0-35.9, adult: Secondary | ICD-10-CM | POA: Diagnosis not present

## 2018-04-26 ENCOUNTER — Ambulatory Visit: Payer: BLUE CROSS/BLUE SHIELD | Admitting: Neurology

## 2018-04-26 ENCOUNTER — Encounter: Payer: Self-pay | Admitting: Neurology

## 2018-04-26 VITALS — BP 130/86 | HR 72 | Ht 73.0 in | Wt 276.0 lb

## 2018-04-26 DIAGNOSIS — R51 Headache: Secondary | ICD-10-CM

## 2018-04-26 DIAGNOSIS — M542 Cervicalgia: Secondary | ICD-10-CM | POA: Diagnosis not present

## 2018-04-26 DIAGNOSIS — G4486 Cervicogenic headache: Secondary | ICD-10-CM

## 2018-04-26 MED ORDER — GABAPENTIN 300 MG PO CAPS: 300.0000 mg | ORAL_CAPSULE | Freq: Four times a day (QID) | ORAL | 5 refills | Status: DC

## 2018-04-26 MED ORDER — TIZANIDINE HCL 2 MG PO TABS: 2.0000 mg | ORAL_TABLET | Freq: Three times a day (TID) | ORAL | 5 refills | Status: DC | PRN

## 2018-04-26 NOTE — Patient Instructions (Addendum)
1.  Increase gabapentin  to four times daily 2.  We will start a muscle relaxer, tizanidine :  Take 1 tablet at bedtime.    If not better in one week, then take 1 tablet in morning and 1 tablet at bedtime  If not better in one week, then increase to 1 tablet every 8 hours as needed.  Caution for drowsiness and don't drive if you take it. 3.  Follow up in 3 months

## 2018-04-26 NOTE — Progress Notes (Signed)
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   UPDATE: Intensity:  moderate Duration:  3-5 minutes off and on daily.  Sometimes he is pain-free for several hours. Frequency:  daily Frequency of abortive medication: daily He continues to have frequent musculoskeletal neck, shoulder and chest pain.  It is better in the morning but gets worse as the day progresses.  He takes gabapentin at 6 AM, 2 PM and 10 PM.  Pain gets worse around 5 PM. Current NSAIDS:  Celecoxib  twice daily PRN Current analgesics:  no Current triptans:  no Current anti-emetic:  no Current muscle relaxants: no.  He never picked up the tizanidine. Current anti-anxiolytic:  no Current sleep aide:  no Current Antihypertensive medications:  Lisinopril , metoprolol succinate  Current Antidepressant medications:  no Current Anticonvulsant medications:  gabapentin  three times daily Current Vitamins/Herbal/Supplements:  no Current Antihistamines/Decongestants:  no Other therapy:  no   HISTORY:  Onset:  In 2017, he developed left sided radicular neck pain.  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  In July 2017, he underwent cervical laminectomy with posterior spinal fusion a C3-4, C4-5 and C5-6 due to neck and left sided radicular pain.  Following surgery, he has had neck and posterior head pain.  Cervical X-rays on 10/14/16 demonstrated adequate hardware without evidence of loosening. Location:  Occipital region, radiating into neck bilaterally Quality:  throbbing Initial Intensity:  3-4/10 Aura:  no Prodrome:  no Postdrome:   no Associated symptoms:  No nausea, vomiting, photophobia, phonophobia or visual disturbance.  He has not had any new worse headache of his life, or waking up from sleep Initial Duration:  Constantly Initial Frequency:  daily Initial Frequency of abortive medication: daily Triggers/exacerbating factors:  Neck movement Relieving factors:  Tylenol or Advil Activity:  Functions. Limited neck movement due to tightness and pain.  Still has pain radiating down both shoulders.  No arm/hand weakness or numbness.   Past NSAIDS:  Advil Past analgesics:  Tramadol, Tylenol Past abortive triptans:  no Past muscle relaxants:  Yes (does not remember which one but it caused drowsiness)* Other past therapies:  no  PAST MEDICAL HISTORY: Past Medical History:  Diagnosis Date  . GERD (gastroesophageal reflux disease)   . Hyperlipemia   . Hypertension     MEDICATIONS: Current Outpatient Medications on File Prior to Visit  Medication Sig Dispense Refill  . celecoxib (CELEBREX) 200 MG capsule Take 200 capsules by mouth 2 (two) times daily as needed.    . ciprofloxacin-dexamethasone (CIPRODEX) OTIC suspension Place 4 drops into the left ear 2 (two) times daily. (Patient not taking: Reported on 01/02/2018) 7.5 mL 0  . ibuprofen (ADVIL,MOTRIN) 200 MG tablet Take 200 mg by mouth every 6 (six) hours as needed for mild pain.     Marland Kitchen lisinopril (PRINIVIL,ZESTRIL) 40 MG tablet Take 40 mg by mouth daily.    . metoprolol succinate (TOPROL-XL) 100 MG 24 hr tablet Take 1 tablet (100 mg total) by mouth daily. Take with or immediately following a meal. 30 tablet 0  . simvastatin (ZOCOR) 10 MG tablet Take 10 mg by mouth daily.     . tamsulosin (FLOMAX) 0.4 MG CAPS  capsule Take 0.4 mg by mouth daily after supper.     No current facility-administered medications on file prior to visit.     ALLERGIES: No Known Allergies  FAMILY HISTORY: Family History  Problem Relation Age of Onset  . Lung disease Father      SOCIAL HISTORY: Social History   Socioeconomic History  . Marital status: Widowed    Spouse name: Not on file  . Number of children: 2  . Years of education: Not on file  . Highest education level: Not on file  Occupational History  . Not on file  Social Needs  . Financial resource strain: Not on file  . Food insecurity:    Worry: Not on file    Inability: Not on file  . Transportation needs:    Medical: Not on file    Non-medical: Not on file  Tobacco Use  . Smoking status: Former Smoker    Packs/day: 1.50    Types: Cigarettes    Last attempt to quit: 10/30/2016    Years since quitting: 1.4  . Smokeless tobacco: Never Used  Substance and Sexual Activity  . Alcohol use: No    Alcohol/week: 0.0 oz    Comment: "not since last year" 06/29/2017"  . Drug use: No  . Sexual activity: Not on file  Lifestyle  . Physical activity:    Days per week: Not on file    Minutes per session: Not on file  . Stress: Not on file  Relationships  . Social connections:    Talks on phone: Not on file    Gets together: Not on file    Attends religious service: Not on file    Active member of club or organization: Not on file    Attends meetings of clubs or organizations: Not on file    Relationship status: Not on file  . Intimate partner violence:    Fear of current or ex partner: Not on file    Emotionally abused: Not on file    Physically abused: Not on file    Forced sexual activity: Not on file  Other Topics Concern  . 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    REVIEW OF SYSTEMS: Constitutional: No fevers, chills, or sweats, no generalized fatigue, change in appetite Eyes: No visual changes, double vision, eye pain Ear, nose and throat: No hearing loss, ear pain, nasal congestion, sore throat Cardiovascular: No chest pain, palpitations Respiratory:  No shortness of breath at rest or with exertion, wheezes GastrointestinaI: No nausea,  vomiting, diarrhea, abdominal pain, fecal incontinence Genitourinary:  No dysuria, urinary retention or frequency Musculoskeletal:  Neck, shoulder and chest pain Integumentary: No rash, pruritus, skin lesions Neurological: as above Psychiatric: No depression, insomnia, anxiety Endocrine: No palpitations, fatigue, diaphoresis, mood swings, change in appetite, change in weight, increased thirst Hematologic/Lymphatic:  No purpura, petechiae. Allergic/Immunologic: no itchy/runny eyes, nasal congestion, recent allergic reactions, rashes  PHYSICAL EXAM: Vitals:   04/26/18 0727  BP: 130/86  Pulse: 72  SpO2: 98%   General: No acute distress.  Patient appears well-groomed.   Head:  Normocephalic/atraumatic Eyes:  Fundi examined but not visualized Neck: supple, bilateral paraspinal tenderness  And into shoulders, decreased range of motion Heart:  Regular rate and rhythm Lungs:  Clear to auscultation bilaterally Back: No paraspinal tenderness Neurological Exam: alert and oriented to person, place, and time. Attention span and concentration intact, recent and remote memory intact, fund of knowledge  intact.  Speech fluent and not dysarthric, language intact.  CN II-XII intact. Bulk and tone normal, muscle strength 5/5 throughout.  Sensation to light touch  intact.  Deep tendon reflexes 2+ throughout.  Finger to nose testing intact.  Gait normal  IMPRESSION: Cervicalgia with cervicogenic headaches  PLAN: 1.  He will increase gabapentin  to 4 times daily so pain control will carry through to next dose. 2.  Start tizandine  at bedtime.  May gradually increase to three times daily as needed.  Cautioned him regarding drowsiness and not to drive if taken. 3.  He is also take Celebrex as per his other physician 4.  Follow up in 3 months.  Shon Millet, DO  CC: Junious Dresser, MD

## 2018-05-04 ENCOUNTER — Ambulatory Visit: Payer: BLUE CROSS/BLUE SHIELD | Admitting: Neurology

## 2018-05-24 DIAGNOSIS — N401 Enlarged prostate with lower urinary tract symptoms: Secondary | ICD-10-CM | POA: Diagnosis not present

## 2018-05-24 DIAGNOSIS — R972 Elevated prostate specific antigen [PSA]: Secondary | ICD-10-CM | POA: Diagnosis not present

## 2018-06-06 DIAGNOSIS — Z6834 Body mass index (BMI) 34.0-34.9, adult: Secondary | ICD-10-CM | POA: Diagnosis not present

## 2018-06-06 DIAGNOSIS — M25562 Pain in left knee: Secondary | ICD-10-CM | POA: Diagnosis not present

## 2018-06-13 DIAGNOSIS — M1712 Unilateral primary osteoarthritis, left knee: Secondary | ICD-10-CM | POA: Diagnosis not present

## 2018-07-10 DIAGNOSIS — M1712 Unilateral primary osteoarthritis, left knee: Secondary | ICD-10-CM | POA: Diagnosis not present

## 2018-07-27 NOTE — Progress Notes (Signed)
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  UPDATE: His left ankle and knee are swelling and thinks it may be due to the gabapentin.  He had a shot in the knee. Intensity:  moderate Duration:  An hour off and on Frequency:  daily Frequency of abortive medication: Celebrex daily Current NSAIDS:  Celecoxib 200mg  twice daily PRN Current analgesics:  no Current triptans:  no Current ergotamine:  no Current anti-emetic:  no Current muscle relaxants:  Tizanidine 2mg  twice daily (makes him feel a little dizzy Current anti-anxiolytic:   Current sleep aide:  no Current Antihypertensive medications:  Lisinopril 20mg , Toprol XL 100mg  Current Antidepressant medications:  no Current Anticonvulsant medications:  Gabapentin 300mg  four times daily Current anti-CGRP:  no Current Vitamins/Herbal/Supplements:  no Current Antihistamines/Decongestants:  no Other therapy:  no   HISTORY: Onset:  In 2017, he developed left sided radicular neck pain.  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  In July 2017, he underwent cervical laminectomy with posterior spinal fusion a C3-4, C4-5 and C5-6 due to neck and left sided radicular pain.  Following surgery, he has had neck and posterior head pain.  Cervical X-rays on 10/14/16 demonstrated adequate hardware without evidence of loosening. Location:  Occipital region, radiating into neck bilaterally Quality:  throbbing Initial Intensity:  3-4/10 Aura:  no Prodrome:  no Postdrome:  no Associated symptoms:  No nausea, vomiting, photophobia, phonophobia or visual disturbance.  He has not had any  new worse headache of his life, or waking up from sleep Initial Duration:  Constantly Initial Frequency:  daily Initial Frequency of abortive medication: daily Aggravating factors:  Neck movement Relieving factors:  Tylenol or Advil Activity:  Functions. Limited neck movement due to tightness and pain.  Still has pain radiating down both shoulders.  No arm/hand weakness or numbness.  Past NSAIDS:  Advil Past analgesics:  Tramadol, Tylenol Past abortive triptans:  no Past muscle relaxants:  Yes (does not remember which one but it caused drowsiness)* Other past therapies:  no   PAST MEDICAL HISTORY: Past Medical History:  Diagnosis Date  . GERD (gastroesophageal reflux disease)   . Hyperlipemia   . Hypertension     MEDICATIONS: Current Outpatient Medications on File Prior to Visit  Medication Sig Dispense Refill  . celecoxib (CELEBREX) 200 MG capsule Take 200 capsules by mouth 2 (two) times daily as needed.    . ciprofloxacin-dexamethasone (CIPRODEX) OTIC suspension Place 4 drops into the left ear 2 (two) times daily. (Patient not taking: Reported on 01/02/2018) 7.5 mL 0  . gabapentin (NEURONTIN) 300 MG capsule Take 1 capsule (300 mg total) by mouth 4 (four) times daily. 120 capsule 5  . ibuprofen (ADVIL,MOTRIN) 200 MG tablet Take 200 mg by mouth every 6 (six) hours as needed for mild pain.     Marland Kitchen. lisinopril (PRINIVIL,ZESTRIL) 40 MG tablet Take 40 mg by mouth daily.    . metoprolol succinate (TOPROL-XL) 100 MG 24 hr tablet Take 1 tablet (100 mg total) by mouth daily. Take with or immediately following a meal. 30 tablet 0  . simvastatin (ZOCOR) 10 MG tablet Take 10 mg by mouth daily.     . tamsulosin (FLOMAX)  0.4 MG CAPS capsule Take 0.4 mg by mouth daily after supper.    Marland Kitchen tiZANidine (ZANAFLEX) 2 MG tablet Take 1 tablet (2 mg total) by mouth every 8 (eight) hours as needed for muscle spasms. 90 tablet 5   No current facility-administered medications on file prior to visit.      ALLERGIES: No Known Allergies  FAMILY HISTORY: Family History  Problem Relation Age of Onset  . Lung disease Father    SOCIAL HISTORY: Social History   Socioeconomic History  . Marital status: Widowed    Spouse name: Not on file  . Number of children: 2  . Years of education: Not on file  . Highest education level: Not on file  Occupational History  . Not on file  Social Needs  . Financial resource strain: Not on file  . Food insecurity:    Worry: Not on file    Inability: Not on file  . Transportation needs:    Medical: Not on file    Non-medical: Not on file  Tobacco Use  . Smoking status: Former Smoker    Packs/day: 1.50    Types: Cigarettes    Last attempt to quit: 10/30/2016    Years since quitting: 1.7  . Smokeless tobacco: Never Used  Substance and Sexual Activity  . Alcohol use: No    Alcohol/week: 0.0 standard drinks    Comment: "not since last year" 06/29/2017"  . Drug use: No  . Sexual activity: Not on file  Lifestyle  . Physical activity:    Days per week: Not on file    Minutes per session: Not on file  . Stress: Not on file  Relationships  . Social connections:    Talks on phone: Not on file    Gets together: Not on file    Attends religious service: Not on file    Active member of club or organization: Not on file    Attends meetings of clubs or organizations: Not on file    Relationship status: Not on file  . Intimate partner violence:    Fear of current or ex partner: Not on file    Emotionally abused: Not on file    Physically abused: Not on file    Forced sexual activity: Not on file  Other Topics Concern  . 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    REVIEW OF SYSTEMS: Constitutional: No fevers, chills, or sweats, no generalized fatigue, change in appetite Eyes: No visual changes, double vision, eye pain Ear, nose and throat: No hearing loss, ear pain, nasal congestion, sore  throat Cardiovascular: No chest pain, palpitations Respiratory:  No shortness of breath at rest or with exertion, wheezes GastrointestinaI: No nausea, vomiting, diarrhea, abdominal pain, fecal incontinence Genitourinary:  No dysuria, urinary retention or frequency Musculoskeletal:  Neck pain, left knee pain, calf pain Integumentary: No rash, pruritus, skin lesions Neurological: as above Psychiatric: No depression, insomnia, anxiety Endocrine: No palpitations, fatigue, diaphoresis, mood swings, change in appetite, change in weight, increased thirst Hematologic/Lymphatic:  No purpura, petechiae. Allergic/Immunologic: no itchy/runny eyes, nasal congestion, recent allergic reactions, rashes  PHYSICAL EXAM: Blood pressure (!) 136/92, pulse 71, height 6\' 1"  (1.854 m), weight 276 lb (125.2 kg), SpO2 98 %. General: No acute distress.  Patient appears well-groomed.   Head:  Normocephalic/atraumatic Eyes:  Fundi examined but not visualized Neck: supple, mild paraspinal tenderness, full range of motion Heart:  Regular rate and rhythm Lungs:  Clear to auscultation bilaterally Back: No paraspinal tenderness Neurological Exam: alert and oriented to person, place, and time. Attention span and concentration intact, recent and remote memory intact, fund of knowledge intact.  Speech fluent and not dysarthric, language intact.  CN II-XII intact. Bulk and tone normal, muscle strength 5/5 throughout.  Sensation to light touch, temperature and vibration intact.  Deep tendon reflexes 2+ throughout, toes downgoing.  Finger to nose and heel to shin testing intact.  Gait normal, Romberg negative.  IMPRESSION: Cervicogenic headache Cervicalgia Left lower leg pain and swelling - rule out DVT  PLAN: 1.  Due to concern of swelling in the leg and the fact that he continues to have daily headache, taper off gabapentin. 2.  Start nortriptyline 25mg  at bedtime.  If headaches not improved in 4 weeks, he should contact  us and we can increase dose to 75mg  at bedtime 3.  Tizanidine 2mg  twice daily for neck pain 4.  Limit pain relievers to no more than 2 days out of week to prevent rebound headache. 5.  Keep headache diary. 6.  Venous ultrasound of left lower extremity to evaluate for DVT. 7.  Follow up in 5 months.  Shon Millet, DO  CC: Junious Dresser, MD

## 2018-07-30 ENCOUNTER — Ambulatory Visit: Payer: BLUE CROSS/BLUE SHIELD | Admitting: Neurology

## 2018-07-30 ENCOUNTER — Encounter

## 2018-07-30 ENCOUNTER — Encounter: Payer: Self-pay | Admitting: Neurology

## 2018-07-30 VITALS — BP 136/92 | HR 71 | Ht 73.0 in | Wt 276.0 lb

## 2018-07-30 DIAGNOSIS — M542 Cervicalgia: Secondary | ICD-10-CM

## 2018-07-30 DIAGNOSIS — R51 Headache: Secondary | ICD-10-CM

## 2018-07-30 DIAGNOSIS — I10 Essential (primary) hypertension: Secondary | ICD-10-CM | POA: Diagnosis not present

## 2018-07-30 DIAGNOSIS — G4486 Cervicogenic headache: Secondary | ICD-10-CM

## 2018-07-30 DIAGNOSIS — M79662 Pain in left lower leg: Secondary | ICD-10-CM

## 2018-07-30 DIAGNOSIS — M7989 Other specified soft tissue disorders: Secondary | ICD-10-CM

## 2018-07-30 MED ORDER — NORTRIPTYLINE HCL 25 MG PO CAPS: 25.0000 mg | ORAL_CAPSULE | Freq: Every day | ORAL | 3 refills | Status: DC

## 2018-07-30 NOTE — Patient Instructions (Addendum)
1.  We will taper off of gabapentin.  Take 1 pill three times daily for 1 week, then twice daily for 1 week, then STOP 2.  At the same time, start nortriptyline 25mg  at bedtime.  If headaches not improved in 4 weeks, contact me and we can increase dose. 3.  Take tizanidine 2mg  twice daily 4.  Limit use of pain relievers to no more than 2 days out of week to prevent rebound headache  5.  Keep headache diary 6.  We will get a venous ultrasound of left leg to look for blood clot 7.  Follow up in 5 months.  [facility] and they will call you directly to schedule your appt. If you need to contact them directly please call [phone].

## 2018-08-14 ENCOUNTER — Other Ambulatory Visit: Payer: Self-pay

## 2018-08-14 DIAGNOSIS — K219 Gastro-esophageal reflux disease without esophagitis: Secondary | ICD-10-CM | POA: Diagnosis not present

## 2018-08-14 DIAGNOSIS — M79662 Pain in left lower leg: Secondary | ICD-10-CM

## 2018-08-14 DIAGNOSIS — Z1339 Encounter for screening examination for other mental health and behavioral disorders: Secondary | ICD-10-CM | POA: Diagnosis not present

## 2018-08-14 DIAGNOSIS — R609 Edema, unspecified: Secondary | ICD-10-CM | POA: Diagnosis not present

## 2018-08-14 DIAGNOSIS — M7989 Other specified soft tissue disorders: Principal | ICD-10-CM

## 2018-08-14 DIAGNOSIS — E785 Hyperlipidemia, unspecified: Secondary | ICD-10-CM | POA: Diagnosis not present

## 2018-08-14 DIAGNOSIS — I10 Essential (primary) hypertension: Secondary | ICD-10-CM | POA: Diagnosis not present

## 2018-08-15 ENCOUNTER — Other Ambulatory Visit: Payer: BLUE CROSS/BLUE SHIELD

## 2018-08-15 ENCOUNTER — Ambulatory Visit
Admission: RE | Admit: 2018-08-15 | Discharge: 2018-08-15 | Disposition: A | Discharge status: Home / Self Care | Payer: BLUE CROSS/BLUE SHIELD | Source: Ambulatory Visit | Attending: Neurology | Admitting: Neurology

## 2018-08-15 DIAGNOSIS — M7122 Synovial cyst of popliteal space [Baker], left knee: Secondary | ICD-10-CM | POA: Diagnosis not present

## 2018-08-15 DIAGNOSIS — M79662 Pain in left lower leg: Secondary | ICD-10-CM

## 2018-08-15 DIAGNOSIS — M7989 Other specified soft tissue disorders: Principal | ICD-10-CM

## 2018-08-16 ENCOUNTER — Telehealth: Payer: Self-pay

## 2018-08-16 NOTE — Telephone Encounter (Signed)
-----   Message from Drema DallasAdam R Jaffe, DO sent at 08/15/2018  3:07 PM EDT ----- Ultrasound shows cyst behind the left knee.  This is often asymptomatic or may cause pain behind the knee or calf but I do not suspect it would cause swelling of the leg.  No blood clots noted.

## 2018-08-16 NOTE — Telephone Encounter (Signed)
Called Pt to advise of US results, no answer. LMOVM advising of result and to call with any questions

## 2018-09-07 DIAGNOSIS — M1712 Unilateral primary osteoarthritis, left knee: Secondary | ICD-10-CM | POA: Diagnosis not present

## 2018-10-16 DIAGNOSIS — M1712 Unilateral primary osteoarthritis, left knee: Secondary | ICD-10-CM | POA: Diagnosis not present

## 2018-10-29 ENCOUNTER — Ambulatory Visit: Payer: BLUE CROSS/BLUE SHIELD | Admitting: Neurology

## 2018-10-29 DIAGNOSIS — I10 Essential (primary) hypertension: Secondary | ICD-10-CM | POA: Diagnosis not present

## 2018-10-29 DIAGNOSIS — E669 Obesity, unspecified: Secondary | ICD-10-CM | POA: Diagnosis not present

## 2018-10-29 DIAGNOSIS — Z6835 Body mass index (BMI) 35.0-35.9, adult: Secondary | ICD-10-CM | POA: Diagnosis not present

## 2018-10-29 DIAGNOSIS — R05 Cough: Secondary | ICD-10-CM | POA: Diagnosis not present

## 2018-11-23 DIAGNOSIS — N401 Enlarged prostate with lower urinary tract symptoms: Secondary | ICD-10-CM | POA: Diagnosis not present

## 2018-11-23 DIAGNOSIS — R972 Elevated prostate specific antigen [PSA]: Secondary | ICD-10-CM | POA: Diagnosis not present

## 2018-12-10 ENCOUNTER — Other Ambulatory Visit: Payer: Self-pay | Admitting: Neurology

## 2018-12-17 DIAGNOSIS — E785 Hyperlipidemia, unspecified: Secondary | ICD-10-CM | POA: Diagnosis not present

## 2018-12-17 DIAGNOSIS — K219 Gastro-esophageal reflux disease without esophagitis: Secondary | ICD-10-CM | POA: Diagnosis not present

## 2018-12-17 DIAGNOSIS — M159 Polyosteoarthritis, unspecified: Secondary | ICD-10-CM | POA: Diagnosis not present

## 2018-12-17 DIAGNOSIS — I10 Essential (primary) hypertension: Secondary | ICD-10-CM | POA: Diagnosis not present

## 2018-12-28 NOTE — Progress Notes (Signed)
NEUROLOGY FOLLOW UP OFFICE NOTE  Michael Whitaker [id number]  HISTORY OF PRESENT ILLNESS: Michael Whitaker is a 63 year old right-handed male with hypertension, hyperlipidemia and GERD who follows up for cervical genic headache and cervicalgia.  UPDATE: In August, he endorsed swelling in the left leg.  He had a venous ultrasound which demonstrated a Baker's cyst in the medial popliteal fossa but no DVT to explain leg swelling. . Intensity:  Mild to moderate Duration:  Intermittently throughout day Frequency:  Off and on daily Current NSAIDS:  Celecoxib 200 mg twice daily as needed Current analgesics: None Current triptans: None Current ergotamine: None Current anti-emetic: None Current muscle relaxants: Tizanidine 2 mg twice daily for neck pain Current anti-anxiolytic: None Current sleep aide: None Current Antihypertensive medications: Lisinopril, Toprol-XL Current Antidepressant medications: Nortriptyline 25 mg at bedtime Current Anticonvulsant medications: None Current anti-CGRP: None Current Vitamins/Herbal/Supplements: None Current Antihistamines/Decongestants: None Other therapy: None  HISTORY:  Onset: In 2017, he developed left sided radicular neck pain. 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 In July 2017, he underwent cervical laminectomy with posterior spinal fusion a C3-4, C4-5 and C5-6 due to neck and left sided radicular pain. Following surgery, he has had neck and posterior head pain. Cervical X-rays on 10/14/16 demonstrated adequate hardware without evidence of loosening. Location: Occipital region, radiating into neck bilaterally Quality: throbbing Initial Intensity: 3-4/10 Aura: no Prodrome: no Postdrome: no Associated symptoms: None. No nausea, vomiting, photophobia, phonophobia  or visual disturbance. He has not had any new worse headache of his life, or waking up from sleep Initial Duration: Constantly Initial Frequency: daily Initial Frequency of abortive medication: daily Aggravating factors: Neck movement Relieving factors:  Tylenol or Advil Activity: Functions. Limited neck movement due to tightness and pain. Still has pain radiating down both shoulders. No arm/hand weakness or numbness.  Past NSAIDS: Advil Past analgesics: Tramadol, Tylenol Past abortive triptans: no Past muscle relaxants: Yes (does not remember which one but it caused drowsiness) Past antiepileptic: Gabapentin (ineffective) Other past therapies: no  PAST MEDICAL HISTORY: Past Medical History:  Diagnosis Date  . GERD (gastroesophageal reflux disease)   . Hyperlipemia   . Hypertension     MEDICATIONS: Current Outpatient Medications on File Prior to Visit  Medication Sig Dispense Refill  . celecoxib (CELEBREX) 200 MG capsule Take 200 capsules by mouth 2 (two) times daily as needed.    . ciprofloxacin-dexamethasone (CIPRODEX) OTIC suspension Place 4 drops into the left ear 2 (two) times daily. (Patient not taking: Reported on 01/02/2018) 7.5 mL 0  . ibuprofen (ADVIL,MOTRIN) 200 MG tablet Take 200 mg by mouth every 6 (six) hours as needed for mild pain.     Marland Kitchen lisinopril (PRINIVIL,ZESTRIL) 40 MG tablet Take 40 mg by mouth daily.    . metoprolol succinate (TOPROL-XL) 100 MG 24 hr tablet Take 1 tablet (100 mg total) by mouth daily. Take with or immediately following a meal. 30 tablet 0  . nortriptyline (PAMELOR) 25 MG capsule TAKE 1 CAPSULE BY MOUTH AT BEDTIME 30 capsule 0  . simvastatin (ZOCOR) 10 MG tablet Take 10 mg by mouth daily.     . tamsulosin (FLOMAX) 0.4 MG CAPS capsule Take 0.4 mg by mouth daily after supper.    Marland Kitchen tiZANidine (ZANAFLEX) 2 MG tablet Take 1 tablet (2 mg total) by mouth every 8 (eight) hours as needed for muscle  spasms. 90 tablet 5   No current  facility-administered medications on file prior to visit.     ALLERGIES: No Known Allergies  FAMILY HISTORY: Family History  Problem Relation Age of Onset  . Lung disease Father    SOCIAL HISTORY: Social History   Socioeconomic History  . Marital status: Widowed    Spouse name: Not on file  . Number of children: 2  . Years of education: Not on file  . Highest education level: Not on file  Occupational History  . Not on file  Social Needs  . Financial resource strain: Not on file  . Food insecurity:    Worry: Not on file    Inability: Not on file  . Transportation needs:    Medical: Not on file    Non-medical: Not on file  Tobacco Use  . Smoking status: Former Smoker    Packs/day: 1.50    Types: Cigarettes    Last attempt to quit: 10/30/2016    Years since quitting: 2.1  . Smokeless tobacco: Never Used  Substance and Sexual Activity  . Alcohol use: No    Alcohol/week: 0.0 standard drinks    Comment: "not since last year" 06/29/2017"  . Drug use: No  . Sexual activity: Not on file  Lifestyle  . Physical activity:    Days per week: Not on file    Minutes per session: Not on file  . Stress: Not on file  Relationships  . Social connections:    Talks on phone: Not on file    Gets together: Not on file    Attends religious service: Not on file    Active member of club or organization: Not on file    Attends meetings of clubs or organizations: Not on file    Relationship status: Not on file  . Intimate partner violence:    Fear of current or ex partner: Not on file    Emotionally abused: Not on file    Physically abused: Not on file    Forced sexual activity: Not on file  Other Topics Concern  . 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    REVIEW OF SYSTEMS: Constitutional: No fevers, chills, or sweats, no generalized fatigue, change in appetite Eyes: No visual changes, double vision, eye pain Ear, nose and throat: No hearing  loss, ear pain, nasal congestion, sore throat Cardiovascular: No chest pain, palpitations Respiratory:  No shortness of breath at rest or with exertion, wheezes GastrointestinaI: No nausea, vomiting, diarrhea, abdominal pain, fecal incontinence Genitourinary:  No dysuria, urinary retention or frequency Musculoskeletal: Neck pain Integumentary: No rash, pruritus, skin lesions Neurological: as above Psychiatric: No depression, insomnia, anxiety Endocrine: No palpitations, fatigue, diaphoresis, mood swings, change in appetite, change in weight, increased thirst Hematologic/Lymphatic:  No purpura, petechiae. Allergic/Immunologic: no itchy/runny eyes, nasal congestion, recent allergic reactions, rashes  PHYSICAL EXAM: Blood pressure 126/88, pulse 82, height 6\' 1"  (1.854 m), weight 275 lb (124.7 kg), SpO2 91 %. General: No acute distress.  Patient appears well-groomed.   Head:  Normocephalic/atraumatic Eyes:  Fundi examined but not visualized Neck: supple, bilateral paraspinal tenderness, full range of motion Heart:  Regular rate and rhythm Lungs:  Clear to auscultation bilaterally Back: No paraspinal tenderness Neurological Exam: .alert and oriented to person, place, and time. Attention span and concentration intact, recent and remote memory intact, fund of knowledge intact.  Speech fluent and not dysarthric, language intact.  CN II-XII intact.  Bulk and tone normal, muscle strength 5/5 throughout.  Sensation to light touch  intact.  Deep tendon reflexes 2+ throughout.  Finger to nose testing intact.  Gait normal, Romberg negative.  IMPRESSION: Cervicogenic headache/chronic tension type headache, not intractable  PLAN: 1.  Increase nortriptyline to 50 mg at bedtime 2.  Increase tizanidine 2 mg to 3 times daily 3.Limit use of pain relievers to no more than 2 days out of week to prevent risk of rebound or medication-overuse headache. 4.  Keep headache diary 5.  Follow-up in 6 months  Shon MilletAdam  Elain Wixon, DO  CC: Junious DresserStephen Campbell, MD

## 2018-12-31 ENCOUNTER — Ambulatory Visit: Payer: BLUE CROSS/BLUE SHIELD | Admitting: Neurology

## 2018-12-31 ENCOUNTER — Encounter: Payer: Self-pay | Admitting: Neurology

## 2018-12-31 VITALS — BP 126/88 | HR 82 | Ht 73.0 in | Wt 275.0 lb

## 2018-12-31 DIAGNOSIS — R51 Headache: Secondary | ICD-10-CM

## 2018-12-31 DIAGNOSIS — G44229 Chronic tension-type headache, not intractable: Secondary | ICD-10-CM | POA: Diagnosis not present

## 2018-12-31 DIAGNOSIS — M542 Cervicalgia: Secondary | ICD-10-CM | POA: Diagnosis not present

## 2018-12-31 DIAGNOSIS — G4486 Cervicogenic headache: Secondary | ICD-10-CM

## 2018-12-31 MED ORDER — NORTRIPTYLINE HCL 50 MG PO CAPS: 50.0000 mg | ORAL_CAPSULE | Freq: Every day | ORAL | 5 refills | Status: DC

## 2018-12-31 MED ORDER — TIZANIDINE HCL 2 MG PO TABS: 2.0000 mg | ORAL_TABLET | Freq: Three times a day (TID) | ORAL | 5 refills | Status: DC

## 2018-12-31 NOTE — Patient Instructions (Signed)
1.  Increase nortriptyline to 50mg  at bedtime 2.  Increase tizanidine to 2mg  three times daily 3.  Limit use of pain relievers to no more than 2 days out of week to prevent risk of rebound or medication-overuse headache. 4.  Keep headache diary 5.  Follow up in 6 months.

## 2019-01-01 DIAGNOSIS — D72829 Elevated white blood cell count, unspecified: Secondary | ICD-10-CM | POA: Diagnosis not present

## 2019-01-17 ENCOUNTER — Encounter: Payer: Self-pay | Admitting: Podiatry

## 2019-01-17 ENCOUNTER — Ambulatory Visit: Payer: BLUE CROSS/BLUE SHIELD | Admitting: Podiatry

## 2019-01-17 DIAGNOSIS — M722 Plantar fascial fibromatosis: Secondary | ICD-10-CM

## 2019-01-17 MED ORDER — TRIAMCINOLONE ACETONIDE 10 MG/ML IJ SUSP
10.0000 mg | Freq: Once | INTRAMUSCULAR | Status: AC
  Administered 2019-01-17: 10 mg

## 2019-01-17 NOTE — Progress Notes (Signed)
Subjective:   Patient ID: Michael AugustaEdward J Whitaker, male   DOB: 63 y.o.   MRN: [id number]   HPI Patient presents stating he is having a lot of pain in his mid arch area left and also has a lesion that is formed and stated he had real good relief for a significant period of time   ROS      Objective:  Physical Exam  Neurovascular status intact with mid arch fasciitis-like symptoms left with lesion formation also in proximity to this area     Assessment:  Acute mid arch plantar fasciitis left with lesion formation     Plan:  Sterile prep and then went ahead and injected the fascia 3 mg Kenalog 5 mg Xylocaine and debrided lesions and advised on stretching exercises and reappoint as needed

## 2019-02-07 DIAGNOSIS — Z6836 Body mass index (BMI) 36.0-36.9, adult: Secondary | ICD-10-CM | POA: Diagnosis not present

## 2019-02-07 DIAGNOSIS — M5416 Radiculopathy, lumbar region: Secondary | ICD-10-CM | POA: Diagnosis not present

## 2019-02-07 DIAGNOSIS — Z79899 Other long term (current) drug therapy: Secondary | ICD-10-CM | POA: Diagnosis not present

## 2019-03-17 ENCOUNTER — Other Ambulatory Visit: Payer: Self-pay

## 2019-03-17 ENCOUNTER — Emergency Department (HOSPITAL_COMMUNITY)
Admission: EM | Admit: 2019-03-17 | Discharge: 2019-03-18 | Disposition: A | Discharge status: Home / Self Care | Payer: BLUE CROSS/BLUE SHIELD | Attending: Emergency Medicine | Admitting: Emergency Medicine

## 2019-03-17 ENCOUNTER — Encounter (HOSPITAL_COMMUNITY): Payer: Self-pay | Admitting: *Deleted

## 2019-03-17 DIAGNOSIS — Y93D3 Activity, furniture building and finishing: Secondary | ICD-10-CM | POA: Insufficient documentation

## 2019-03-17 DIAGNOSIS — Z79899 Other long term (current) drug therapy: Secondary | ICD-10-CM | POA: Diagnosis not present

## 2019-03-17 DIAGNOSIS — W01190A Fall on same level from slipping, tripping and stumbling with subsequent striking against furniture, initial encounter: Secondary | ICD-10-CM | POA: Insufficient documentation

## 2019-03-17 DIAGNOSIS — W19XXXA Unspecified fall, initial encounter: Secondary | ICD-10-CM

## 2019-03-17 DIAGNOSIS — Y929 Unspecified place or not applicable: Secondary | ICD-10-CM | POA: Diagnosis not present

## 2019-03-17 DIAGNOSIS — I10 Essential (primary) hypertension: Secondary | ICD-10-CM | POA: Diagnosis not present

## 2019-03-17 DIAGNOSIS — Z23 Encounter for immunization: Secondary | ICD-10-CM | POA: Insufficient documentation

## 2019-03-17 DIAGNOSIS — Y999 Unspecified external cause status: Secondary | ICD-10-CM | POA: Diagnosis not present

## 2019-03-17 DIAGNOSIS — S01511A Laceration without foreign body of lip, initial encounter: Secondary | ICD-10-CM | POA: Diagnosis not present

## 2019-03-17 DIAGNOSIS — Z87891 Personal history of nicotine dependence: Secondary | ICD-10-CM | POA: Insufficient documentation

## 2019-03-17 MED ORDER — LIDOCAINE-EPINEPHRINE (PF) 2 %-1:200000 IJ SOLN
20.0000 mL | Freq: Once | INTRAMUSCULAR | Status: DC
  Filled 2019-03-17: qty 20

## 2019-03-17 MED ORDER — TETANUS-DIPHTH-ACELL PERTUSSIS 5-2.5-18.5 LF-MCG/0.5 IM SUSP
0.5000 mL | Freq: Once | INTRAMUSCULAR | Status: AC
  Administered 2019-03-18: 0.5 mL via INTRAMUSCULAR
  Filled 2019-03-17: qty 0.5

## 2019-03-17 NOTE — ED Triage Notes (Signed)
The pt fell earlier tonight when he tripped  He has somwe mouth and nose pain  No loc  Nol there  P[ain no loose teeth

## 2019-03-17 NOTE — ED Provider Notes (Signed)
[facility] EMERGENCY DEPARTMENT Provider Note   CSN: [id number] Arrival date & time: 03/17/19  2200    History   Chief Complaint Chief Complaint  Patient presents with  . Fall    HPI Michael Whitaker is a 63 y.o. male with a hx of GERD, hyperlipidemia, hypertension presents to the Emergency Department complaining of acute, persistent laceration to the upper lip after fall around 9:30pm.  Pt reports he was polishing a chair when she slipped and fell onto the corner of the chair.  Pt denies striking any other place on his face or head.  He denies LOC, neck pain, chest pain, SOB, abd pain, N/V/D, vision changes.  Pt denies EtOH or drugs usage.  Pt reports hemostasis after pressure applied.  No aggravating factors.  Unknown last tetanus.      The history is provided by the patient and medical records. No language interpreter was used.    Past Medical History:  Diagnosis Date  . GERD (gastroesophageal reflux disease)   . Hyperlipemia   . Hypertension     Patient Active Problem List   Diagnosis Date Noted  . HTN (hypertension) 03/26/2015  . Tobacco abuse 03/26/2015  . Trigger finger, acquired 01/27/2015  . Fracture of fifth metacarpal bone of left hand 01/27/2015  . 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 Mild chronic obstructive pulmonary disease (HCC) 10/06/2011  . History of gastroesophageal reflux (GERD) 10/06/2011  . Osteoarthrosis, unspecified whether generalized or localized, upper arm 09/22/2011    Past Surgical History:  Procedure Laterality Date  . CERVICAL SPINE SURGERY    . ELBOW SURGERY    . UMBILICAL HERNIA REPAIR          Home Medications    Prior to Admission medications   Medication Sig Start Date End Date Taking? Authorizing Provider  Candesartan Cilexetil-HCTZ 32-25 MG TABS Take 1 tablet by mouth daily. 12/10/18   [provider]  celecoxib (CELEBREX) 200 MG capsule Take 200 capsules by mouth 2 (two) times daily as needed.  12/04/17   [provider]  hydrochlorothiazide (HYDRODIURIL) 25 MG tablet  08/14/18   [provider]  ibuprofen (ADVIL,MOTRIN) 200 MG tablet Take 200 mg by mouth every 6 (six) hours as needed for mild pain.     [provider]  metoprolol succinate (TOPROL-XL) 100 MG 24 hr tablet Take 1 tablet (100 mg total) by mouth daily. Take with or immediately following a meal. 01/19/15   Gerhard Munch, MD  nortriptyline (PAMELOR) 50 MG capsule Take 1 capsule (50 mg total) by mouth at bedtime. 12/31/18   Drema Dallas, DO  omeprazole (PRILOSEC) 40 MG capsule Take 1 capsule by mouth daily. 12/30/18   [provider]  simvastatin (ZOCOR) 10 MG tablet Take 10 mg by mouth daily.  03/18/15   [provider]  simvastatin (ZOCOR) 10 MG tablet TAKE 1 TABLET BY MOUTH ONCE DAILY 08/09/18   [provider]  tamsulosin (FLOMAX) 0.4 MG CAPS capsule Take 0.4 mg by mouth daily after supper. 08/10/17   [provider]  tiZANidine (ZANAFLEX) 2 MG tablet Take 1 tablet (2 mg total) by mouth 3 (three) times daily. 12/31/18   Drema Dallas, DO    Family History Family History  Problem Relation Age of Onset  . Lung disease Father     Social History Social History   Tobacco Use  . Smoking status: Former Smoker    Packs/day: 1.50    Types: Cigarettes  Last attempt to quit: 10/30/2016    Years since quitting: 2.3  . Smokeless tobacco: Never Used  Substance Use Topics  . Alcohol use: No    Alcohol/week: 0.0 standard drinks    Comment: "not since last year" 06/29/2017"  . Drug use: No     Allergies   Patient has no known allergies.   Review of Systems Review of Systems  Constitutional: Negative for appetite change, diaphoresis, fatigue, fever and unexpected weight change.  HENT: Negative for facial swelling and mouth sores.   Eyes: Negative for visual disturbance.  Respiratory: Negative for cough, chest tightness, shortness of breath and wheezing.    Cardiovascular: Negative for chest pain.  Gastrointestinal: Negative for abdominal pain, constipation, diarrhea, nausea and vomiting.  Endocrine: Negative for polydipsia, polyphagia and polyuria.  Genitourinary: Negative for dysuria, frequency, hematuria and urgency.  Musculoskeletal: Negative for back pain and neck stiffness.  Skin: Positive for wound. Negative for rash.  Allergic/Immunologic: Negative for immunocompromised state.  Neurological: Negative for syncope, light-headedness and headaches.  Hematological: Does not bruise/bleed easily.  Psychiatric/Behavioral: Negative for sleep disturbance. The patient is not nervous/anxious.      Physical Exam Updated Vital Signs BP 123/80   Pulse 78   Temp 97.9 F (36.6 C) (Oral)   Resp 15   Ht  (1.854 m)   Wt 124.7 kg   SpO2 98%   BMI 36.28 kg/m   Physical Exam Vitals signs and nursing note reviewed.  Constitutional:      General: He is not in acute distress.    Appearance: He is not diaphoretic.  HENT:     Head: Normocephalic. Laceration present.     Jaw: There is normal jaw occlusion. No trismus, tenderness, swelling, pain on movement or malocclusion.      Comments: Pt without teeth,  No TTP of the gingiva.  2cm laceration to the mucosa of the upper lip directly under the outer laceration.   No tenderness to the midface. No epistaxis or septal hematoma Eyes:     General: No scleral icterus.    Conjunctiva/sclera: Conjunctivae normal.     Pupils: Pupils are equal, round, and reactive to light.  Neck:     Musculoskeletal: Normal range of motion. Normal range of motion. No spinous process tenderness or muscular tenderness.     Comments: Full ROM of the cervical pain without pain. No midline or paraspinal tenderness.   Cardiovascular:     Rate and Rhythm: Normal rate and regular rhythm.     Pulses: Normal pulses.          Radial pulses are 2+ on the right side and 2+ on the left side.  Pulmonary:     Effort: No  tachypnea, accessory muscle usage, prolonged expiration, respiratory distress or retractions.     Breath sounds: No stridor.     Comments: Equal chest rise. No increased work of breathing. Abdominal:     General: There is no distension.     Palpations: Abdomen is soft.     Tenderness: There is no abdominal tenderness. There is no guarding or rebound.  Musculoskeletal:     Comments: Moves all extremities equally and without difficulty.  Skin:    General: Skin is warm and dry.     Capillary Refill: Capillary refill takes less than 2 seconds.  Neurological:     Mental Status: He is alert.     GCS: GCS eye subscore is 4. GCS verbal subscore is 5. GCS motor subscore is  6.     Comments: Speech is clear and goal oriented.  Psychiatric:        Mood and Affect: Mood normal.      ED Treatments / Results   EKG EKG Interpretation  Date/Time:  Sunday March 17 2019 22:13:19 EDT Ventricular Rate:  82 PR Interval:    QRS Duration: 98 QT Interval:  392 QTC Calculation: 458 R Axis:   62 Text Interpretation:  Sinus rhythm Probable left atrial enlargement Left ventricular hypertrophy No significant change since last tracing Confirmed by Benjiman Core [phone]) on 03/17/2019 11:16:08 PM    Procedures .Marland KitchenLaceration Repair Date/Time: 03/18/2019 12:57 AM Performed by: Dierdre Forth, PA-C Authorized by: Dierdre Forth, PA-C   Consent:    Consent obtained:  Verbal   Consent given by:  Patient   Risks discussed:  Infection, need for additional repair, pain, poor cosmetic result and poor wound healing   Alternatives discussed:  No treatment and delayed treatment Universal protocol:    Procedure explained and questions answered to patient or proxy's satisfaction: yes     Relevant documents present and verified: yes     Test results available and properly labeled: yes     Imaging studies available: yes     Required blood products, implants, devices, and special equipment  available: yes     Site/side marked: yes     Immediately prior to procedure, a time out was called: yes     Patient identity confirmed:  Verbally with patient Anesthesia (see MAR for exact dosages):    Anesthesia method:  Nerve block   Block location:  Bilateral infraorbital   Block needle gauge:  27 G   Block anesthetic:  Lidocaine 2% WITH epi (2mL each side)   Block injection procedure:  Anatomic landmarks identified, introduced needle, incremental injection, anatomic landmarks palpated and negative aspiration for blood   Block outcome:  Anesthesia achieved Laceration details:    Location:  Lip   Lip location:  Upper lip, full thickness   Vermilion border involved: no     Length (cm):  3.5 Repair type:    Repair type:  Intermediate Pre-procedure details:    Preparation:  Patient was prepped and draped in usual sterile fashion Exploration:    Hemostasis achieved with:  Direct pressure   Wound exploration: entire depth of wound probed and visualized   Treatment:    Area cleansed with:  Saline   Amount of cleaning:  Standard   Irrigation solution:  Sterile water   Irrigation method:  Syringe Subcutaneous repair:    Suture size:  4-0   Suture material:  Fast-absorbing gut   Suture technique:  Simple interrupted   Number of sutures:  1 Skin repair:    Repair method:  Sutures   Suture size:  4-0   Suture material:  Prolene   Suture technique:  Simple interrupted   Number of sutures:  6 Approximation:    Approximation:  Close Post-procedure details:    Dressing:  Open (no dressing)   Patient tolerance of procedure:  Tolerated well, no immediate complications   (including critical care time)  Medications Ordered in ED Medications  lidocaine-EPINEPHrine (XYLOCAINE W/EPI) 2 %-1:200000 (PF) injection 20 mL (20 mLs Infiltration Total Dose 03/18/19 0100)  Tdap (BOOSTRIX) injection 0.5 mL (0.5 mLs Intramuscular Given 03/18/19 0100)     Initial Impression / Assessment and Plan  / ED Course  I have reviewed the triage vital signs and the nursing notes.  Pertinent labs & imaging  results that were available during my care of the patient were reviewed by me and considered in my medical decision making (see chart for details).         Patient presents after mechanical fall.  No loss of consciousness, no syncope.  Laceration noted to the upper lip.  Midface is stable, no evidence of facial fractures.  No headache, vision changes or neck pain.  No imaging indicated at this time.  Wound explored and base of wound visualized in a bloodless field without evidence of foreign body.  Laceration occurred < 8 hours prior to repair which was well tolerated.  Tdap updated.  Pt has no comorbidities to effect normal wound healing. Pt discharged without antibiotics.  Discussed suture home care with patient and answered questions. Pt to follow-up for wound check and suture removal in 7 days; they are to return to the ED sooner for signs of infection. Pt is hemodynamically stable with no complaints prior to dc.   The patient was discussed with and seen by Dr. Rubin PayorPickering who agrees with the treatment plan.    Final Clinical Impressions(s) / ED Diagnoses   Final diagnoses:  Fall, initial encounter  Complicated laceration of lip, initial encounter    ED Discharge Orders    None       Lavonta Tillis, Boyd KerbsHannah, PA-C 03/18/19 0119    Benjiman CorePickering, Nathan, MD 03/18/19 2226

## 2019-03-18 NOTE — Discharge Instructions (Signed)

## 2019-03-25 ENCOUNTER — Ambulatory Visit (HOSPITAL_COMMUNITY)
Admission: EM | Admit: 2019-03-25 | Discharge: 2019-03-25 | Disposition: A | Discharge status: Home / Self Care | Payer: BLUE CROSS/BLUE SHIELD

## 2019-03-25 DIAGNOSIS — Z4802 Encounter for removal of sutures: Secondary | ICD-10-CM

## 2019-03-25 NOTE — ED Triage Notes (Signed)
4 stiches removed from upper lip just under nose, pt tolerated well, skin well approximated

## 2019-04-18 DIAGNOSIS — E785 Hyperlipidemia, unspecified: Secondary | ICD-10-CM | POA: Diagnosis not present

## 2019-04-18 DIAGNOSIS — M159 Polyosteoarthritis, unspecified: Secondary | ICD-10-CM | POA: Diagnosis not present

## 2019-04-18 DIAGNOSIS — I10 Essential (primary) hypertension: Secondary | ICD-10-CM | POA: Diagnosis not present

## 2019-04-18 DIAGNOSIS — K219 Gastro-esophageal reflux disease without esophagitis: Secondary | ICD-10-CM | POA: Diagnosis not present

## 2019-05-06 DIAGNOSIS — Z6836 Body mass index (BMI) 36.0-36.9, adult: Secondary | ICD-10-CM | POA: Diagnosis not present

## 2019-05-06 DIAGNOSIS — Z1331 Encounter for screening for depression: Secondary | ICD-10-CM | POA: Diagnosis not present

## 2019-05-06 DIAGNOSIS — M5416 Radiculopathy, lumbar region: Secondary | ICD-10-CM | POA: Diagnosis not present

## 2019-05-06 DIAGNOSIS — Z79899 Other long term (current) drug therapy: Secondary | ICD-10-CM | POA: Diagnosis not present

## 2019-05-23 ENCOUNTER — Emergency Department (HOSPITAL_COMMUNITY)
Admission: EM | Admit: 2019-05-23 | Discharge: 2019-05-24 | Disposition: A | Discharge status: Home / Self Care | Payer: BC Managed Care – PPO | Attending: Emergency Medicine | Admitting: Emergency Medicine

## 2019-05-23 ENCOUNTER — Encounter (HOSPITAL_COMMUNITY): Payer: Self-pay | Admitting: Emergency Medicine

## 2019-05-23 ENCOUNTER — Emergency Department (HOSPITAL_COMMUNITY): Payer: BC Managed Care – PPO

## 2019-05-23 ENCOUNTER — Other Ambulatory Visit: Payer: Self-pay

## 2019-05-23 DIAGNOSIS — K5730 Diverticulosis of large intestine without perforation or abscess without bleeding: Secondary | ICD-10-CM | POA: Diagnosis not present

## 2019-05-23 DIAGNOSIS — J449 Chronic obstructive pulmonary disease, unspecified: Secondary | ICD-10-CM | POA: Insufficient documentation

## 2019-05-23 DIAGNOSIS — I10 Essential (primary) hypertension: Secondary | ICD-10-CM | POA: Insufficient documentation

## 2019-05-23 DIAGNOSIS — Z87891 Personal history of nicotine dependence: Secondary | ICD-10-CM | POA: Insufficient documentation

## 2019-05-23 DIAGNOSIS — R079 Chest pain, unspecified: Secondary | ICD-10-CM | POA: Diagnosis not present

## 2019-05-23 DIAGNOSIS — R0789 Other chest pain: Secondary | ICD-10-CM | POA: Diagnosis not present

## 2019-05-23 DIAGNOSIS — N289 Disorder of kidney and ureter, unspecified: Secondary | ICD-10-CM | POA: Diagnosis not present

## 2019-05-23 DIAGNOSIS — R911 Solitary pulmonary nodule: Secondary | ICD-10-CM | POA: Diagnosis not present

## 2019-05-23 MED ORDER — SODIUM CHLORIDE 0.9% FLUSH: 3.0000 mL | Freq: Once | INTRAVENOUS | Status: DC

## 2019-05-23 NOTE — ED Triage Notes (Signed)
Reports left sided chest pain that started tonight while watching tv.  Denies any other symptoms.

## 2019-05-23 NOTE — ED Notes (Signed)
Patient transported to X-ray 

## 2019-05-24 ENCOUNTER — Emergency Department (HOSPITAL_COMMUNITY): Payer: BC Managed Care – PPO

## 2019-05-24 DIAGNOSIS — K5730 Diverticulosis of large intestine without perforation or abscess without bleeding: Secondary | ICD-10-CM | POA: Diagnosis not present

## 2019-05-24 DIAGNOSIS — R911 Solitary pulmonary nodule: Secondary | ICD-10-CM | POA: Diagnosis not present

## 2019-05-24 LAB — CBC
HCT: 45 % (ref 39.0–52.0)
Hemoglobin: 14.4 g/dL (ref 13.0–17.0)
MCH: 30.1 pg (ref 26.0–34.0)
MCHC: 32 g/dL (ref 30.0–36.0)
MCV: 93.9 fL (ref 80.0–100.0)
Platelets: 285 10*3/uL (ref 150–400)
RBC: 4.79 MIL/uL (ref 4.22–5.81)
RDW: 13.4 % (ref 11.5–15.5)
WBC: 15.1 10*3/uL — ABNORMAL HIGH (ref 4.0–10.5)
nRBC: 0 % (ref 0.0–0.2)

## 2019-05-24 LAB — HEPATIC FUNCTION PANEL
ALT: 20 U/L (ref 0–44)
AST: 27 U/L (ref 15–41)
Albumin: 2.8 g/dL — ABNORMAL LOW (ref 3.5–5.0)
Alkaline Phosphatase: 69 U/L (ref 38–126)
Bilirubin, Direct: 0.4 mg/dL — ABNORMAL HIGH (ref 0.0–0.2)
Indirect Bilirubin: 0.7 mg/dL (ref 0.3–0.9)
Total Bilirubin: 1.1 mg/dL (ref 0.3–1.2)
Total Protein: 6.4 g/dL — ABNORMAL LOW (ref 6.5–8.1)

## 2019-05-24 LAB — BASIC METABOLIC PANEL
Anion gap: 10 (ref 5–15)
BUN: 12 mg/dL (ref 6–20)
CO2: 24 mmol/L (ref 22–32)
Calcium: 8.5 mg/dL — ABNORMAL LOW (ref 8.9–10.3)
Chloride: 101 mmol/L (ref 98–111)
Creatinine, Ser: 1.05 mg/dL (ref 0.61–1.24)
GFR calc Af Amer: >60 mL/min (ref >60)
GFR calc non Af Amer: >60 mL/min (ref >60)
Glucose, Bld: 134 mg/dL — ABNORMAL HIGH (ref 70–99)
Potassium: 3.8 mmol/L (ref 3.5–5.1)
Sodium: 135 mmol/L (ref 135–145)

## 2019-05-24 LAB — LIPASE, BLOOD: Lipase: 22 U/L (ref 11–51)

## 2019-05-24 LAB — TROPONIN I
Troponin I: <0.03 ng/mL (ref <0.03)
Troponin I: <0.03 ng/mL (ref <0.03)

## 2019-05-24 MED ORDER — IOHEXOL 300 MG/ML  SOLN
100.0000 mL | Freq: Once | INTRAMUSCULAR | Status: AC | PRN
  Administered 2019-05-24: 100 mL via INTRAVENOUS

## 2019-05-24 NOTE — ED Notes (Signed)
Pt not in the room at this moment. Will return for blood draw.

## 2019-05-24 NOTE — ED Notes (Signed)
Discharge instructions and follow up care discussed with pt. Pt verbalized understanding and no questions at this time.

## 2019-05-24 NOTE — ED Provider Notes (Signed)
Emergency Department Provider Note   I have reviewed the triage vital signs and the nursing notes.   HISTORY  Chief Complaint Chest Pain   HPI Michael Whitaker is a 63 y.o. male who presents the emergency department today with left upper quadrant/left lower chest pain.  Patient states this started earlier in the day and has been intermittent in nature since that time.  To have a pressure there.  He states that started on the right side but now is on the left side.  States he is not had a bowel movement today which is abnormal for him.  He states it feels like he might have gas there.  No history of heart disease or family history of heart disease does not smoke.  No nausea, vomiting, shortness of breath, diaphoresis or lightheadedness.  He has mild lower extremity swelling but this is better than it usually is at this point.  No rashes.  No recent trauma.  No fever or cough. states he does feel a little bit bloated.   No other associated or modifying symptoms.    Past Medical History:  Diagnosis Date  . GERD (gastroesophageal reflux disease)   . Hyperlipemia   . Hypertension     Patient Active Problem List   Diagnosis Date Noted  . HTN (hypertension) 03/26/2015  . Tobacco abuse 03/26/2015  . Trigger finger, acquired 01/27/2015  . Fracture of fifth metacarpal bone of left hand 01/27/2015  . 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 Mild chronic obstructive pulmonary disease (HCC) 10/06/2011  . History of gastroesophageal reflux (GERD) 10/06/2011  . Osteoarthrosis, unspecified whether generalized or localized, upper arm 09/22/2011    Past Surgical History:  Procedure Laterality Date  . CERVICAL SPINE SURGERY    . ELBOW SURGERY    . UMBILICAL HERNIA REPAIR      Current Outpatient Rx  . Order #: [id number] Class: Historical Med  . Order #: [id number] Class: Historical Med  . Order #: [id number] Class: Historical Med  . Order #: [id number] Class: Print  . Order #: [id number] Class:  Normal  . Order #: [id number] Class: Historical Med  . Order #: [id number] Class: Historical Med  . Order #: [id number] Class: Historical Med  . Order #: [id number] Class: Normal    Allergies Patient has no known allergies.  Family History  Problem Relation Age of Onset  . Lung disease Father     Social History Social History   Tobacco Use  . Smoking status: Former Smoker    Packs/day: 1.50    Types: Cigarettes    Quit date: 10/30/2016    Years since quitting: 2.5  . Smokeless tobacco: Never Used  Substance Use Topics  . Alcohol use: No    Alcohol/week: 0.0 standard drinks    Comment: "not since last year" 06/29/2017"  . Drug use: No    Review of Systems  All other systems negative except as documented in the HPI. All pertinent positives and negatives as reviewed in the HPI. ____________________________________________   PHYSICAL EXAM:  VITAL SIGNS: ED Triage Vitals  Enc Vitals Group     BP 05/23/19 2327 137/85     Pulse Rate 05/23/19 2327 81     Resp 05/23/19 2327 18     Temp 05/23/19 2327 98.8 F (37.1 C)     Temp Source 05/23/19 2327 Oral     SpO2 05/23/19 2327 98 %     Weight 05/23/19 2328 280 lb (127 kg)     Height 05/23/19 2328 6\' 1"  (1.854 m)  Constitutional: Alert and oriented. Well appearing and in no acute distress. Eyes: Conjunctivae are normal. PERRL. EOMI. Head: Atraumatic. Nose: No congestion/rhinnorhea. Mouth/Throat: Mucous membranes are moist.  Oropharynx non-erythematous. Neck: No stridor.  No meningeal signs.   Cardiovascular: Normal rate, regular rhythm. Good peripheral circulation. Grossly normal heart sounds.   Respiratory: Normal respiratory effort.  No retractions. Lungs CTAB. Gastrointestinal: Soft and nontender. No distention.  Musculoskeletal: No lower extremity tenderness nor edema. No gross deformities of extremities. Neurologic:  Normal speech and language. No gross focal neurologic deficits are appreciated.  Skin:  Skin is warm,  dry and intact. No rash noted.   ____________________________________________   LABS (all labs ordered are listed, but only abnormal results are displayed)  Labs Reviewed  BASIC METABOLIC PANEL - Abnormal; Notable for the following components:      Result Value   Glucose, Bld 134 (*)    Calcium 8.5 (*)    All other components within normal limits  CBC - Abnormal; Notable for the following components:   WBC 15.1 (*)    All other components within normal limits  HEPATIC FUNCTION PANEL - Abnormal; Notable for the following components:   Total Protein 6.4 (*)    Albumin 2.8 (*)    Bilirubin, Direct 0.4 (*)    All other components within normal limits  TROPONIN I  TROPONIN I  LIPASE, BLOOD   ____________________________________________  EKG   EKG Interpretation  Date/Time:  Thursday May 23 2019 23:23:40 EDT Ventricular Rate:  81 PR Interval:  144 QRS Duration: 94 QT Interval:  376 QTC Calculation: 436 R Axis:   67 Text Interpretation:  Normal sinus rhythm Minimal voltage criteria for LVH, may be normal variant Borderline ECG increased amplitude of  T waves compared to most recent, similar to march 2016 Confirmed by Marily MemosMesner, Alanie Syler [phone]([id number]) on 05/24/2019 12:41:34 AM       ____________________________________________  RADIOLOGY  Dg Chest 2 View  Result Date: 05/24/2019 CLINICAL DATA:  Left side chest pain EXAM: CHEST - 2 VIEW COMPARISON:  08/15/2017 FINDINGS: Heart and mediastinal contours are within normal limits. No focal opacities or effusions. No acute bony abnormality. IMPRESSION: No active cardiopulmonary disease. Electronically Signed   By: Charlett NoseKevin  Dover M.D.   On: 05/24/2019 00:16   Ct Abdomen Pelvis W Contrast  Result Date: 05/24/2019 CLINICAL DATA:  Abdominal pain. Patient reports left chest pain. EXAM: CT ABDOMEN AND PELVIS WITH CONTRAST TECHNIQUE: Multidetector CT imaging of the abdomen and pelvis was performed using the standard protocol following bolus  administration of intravenous contrast. CONTRAST:  100mL OMNIPAQUE IOHEXOL 300 MG/ML  SOLN COMPARISON:  None. FINDINGS: Lower chest: 5 mm subpleural nodule left lower lobe. Dependent atelectasis in the right lower lobe. Hepatobiliary: No focal liver abnormality is seen. No gallstones, gallbladder wall thickening, or biliary dilatation. Pancreas: No ductal dilatation or inflammation. Spleen: Normal in size without focal abnormality. Adrenals/Urinary Tract: Normal adrenal glands. No hydronephrosis or perinephric edema. Homogeneous renal enhancement. Absent renal excretion on delayed phase imaging. Urinary bladder is partially distended without wall thickening. Stomach/Bowel: Stomach is decompressed. No small bowel inflammation, obstruction, or wall thickening. Normal appendix. Mild left colonic diverticulosis without diverticulitis. No colonic inflammation. Vascular/Lymphatic: Trace aortic atherosclerosis. No aortic aneurysm. No adenopathy. Portal vein is patent. Reproductive: Prominent prostate gland 5.5 cm. Other: Prior repair of umbilical hernia with mesh. No free air, free fluid, or intra-abdominal fluid collection. Fat in the right inguinal canal. Musculoskeletal: Degenerative change of the spine and hips. Enthesopathic changes at the  iliac crests. Partial fusion of the both sacroiliac joints. There are no acute or suspicious osseous abnormalities. IMPRESSION: 1. No acute abnormality or explanation for abdominal pain. 2. Mild colonic diverticulosis without diverticulitis. 3. Absent renal excretion on delayed phase suggest underlying renal dysfunction. 4. Subpleural 5 mm pulmonary nodule in the left lower lobe. No follow-up needed if patient is low-risk. Non-contrast chest CT can be considered in 12 months if patient is high-risk. This recommendation follows the consensus statement: Guidelines for Management of Incidental Pulmonary Nodules Detected on CT Images: From the Fleischner Society 2017; Radiology 2017;  284:228-243. 5.  Aortic Atherosclerosis (ICD10-I70.0). Electronically Signed   By: Keith Rake M.D.   On: 05/24/2019 03:08    ____________________________________________  INITIAL IMPRESSION / ASSESSMENT AND PLAN / ED COURSE  Symptoms really seem GI in nature rather than cardiac.  Will add on LFTs, lipase and get a CT of his abdomen pelvis.  We will get a delta troponin as he does a left-sided discomfort with multiple risk factors but has a normal EKG and despite his heart score for think is very unlikely to be cardiac at this point.  Obviously if his troponin are elevated or EKG changes or other concerning symptoms arise will consider those appropriately.  Workup unremarkable, pain relieved for multiple hours here without intervention.   Pertinent labs & imaging results that were available during my care of the patient were reviewed by me and considered in my medical decision making (see chart for details).  A medical screening exam was performed and I feel the patient has had an appropriate workup for their chief complaint at this time and likelihood of emergent condition existing is low. They have been counseled on decision, discharge, follow up and which symptoms necessitate immediate return to the emergency department. They or their family verbally stated understanding and agreement with plan and discharged in stable condition.   ____________________________________________  FINAL CLINICAL IMPRESSION(S) / ED DIAGNOSES  Final diagnoses:  Renal dysfunction  Chest pain, unspecified type     MEDICATIONS GIVEN DURING THIS VISIT:  Medications  sodium chloride flush (NS) 0.9 % injection 3 mL (has no administration in time range)  iohexol (OMNIPAQUE) 300 MG/ML solution 100 mL (100 mLs Intravenous Contrast Given 05/24/19 0246)     NEW OUTPATIENT MEDICATIONS STARTED DURING THIS VISIT:  Discharge Medication List as of 05/24/2019  4:49 AM      Note:  This note was prepared with  assistance of Dragon voice recognition software. Occasional wrong-word or sound-a-like substitutions may have occurred due to the inherent limitations of voice recognition software.   Daira Hine, Corene Cornea, MD 05/24/19 [phone]

## 2019-05-27 DIAGNOSIS — N401 Enlarged prostate with lower urinary tract symptoms: Secondary | ICD-10-CM | POA: Diagnosis not present

## 2019-05-27 DIAGNOSIS — R972 Elevated prostate specific antigen [PSA]: Secondary | ICD-10-CM | POA: Diagnosis not present

## 2019-05-28 DIAGNOSIS — Z6835 Body mass index (BMI) 35.0-35.9, adult: Secondary | ICD-10-CM | POA: Diagnosis not present

## 2019-05-28 DIAGNOSIS — E669 Obesity, unspecified: Secondary | ICD-10-CM | POA: Diagnosis not present

## 2019-05-28 DIAGNOSIS — M940 Chondrocostal junction syndrome [Tietze]: Secondary | ICD-10-CM | POA: Diagnosis not present

## 2019-05-28 DIAGNOSIS — Z79899 Other long term (current) drug therapy: Secondary | ICD-10-CM | POA: Diagnosis not present

## 2019-06-11 ENCOUNTER — Encounter (HOSPITAL_COMMUNITY): Payer: Self-pay

## 2019-06-11 ENCOUNTER — Ambulatory Visit (HOSPITAL_COMMUNITY)
Admission: EM | Admit: 2019-06-11 | Discharge: 2019-06-11 | Disposition: A | Discharge status: Home / Self Care | Payer: BC Managed Care – PPO | Attending: Emergency Medicine | Admitting: Emergency Medicine

## 2019-06-11 ENCOUNTER — Other Ambulatory Visit: Payer: Self-pay

## 2019-06-11 DIAGNOSIS — R21 Rash and other nonspecific skin eruption: Secondary | ICD-10-CM | POA: Diagnosis not present

## 2019-06-11 MED ORDER — KETOCONAZOLE 2 % EX CREA: 1.0000 "application " | TOPICAL_CREAM | Freq: Two times a day (BID) | CUTANEOUS | 1 refills | Status: DC

## 2019-06-11 NOTE — ED Triage Notes (Signed)
Patient presents to Urgent Care with complaints of darkened skin discoloration on the top of his left foot which he noticed a few weeks ago. Patient reports it is not painful or itchy.

## 2019-06-11 NOTE — ED Provider Notes (Signed)
HPI  SUBJECTIVE:  Michael Whitaker is a 63 y.o. male who presents with 2 weeks of a hyperpigmented rash on the top of his left foot.  He denies rash elsewhere.  It Does not itch, burn.  No crusting.  No weeping.  No trauma to the area.  He states it is getting bigger.  He is using a new laundry detergent.  No other new lotions or soaps.  No aggravating or alleviating factors.  He has not tried anything for this.  Past medical history negative for diabetes, MRSA, fungal skin infections.  He has a history of hypercholesterolemia, hypertension.  PMD: Dr. Orvan Falconerampbell.   Past Medical History:  Diagnosis Date  . GERD (gastroesophageal reflux disease)   . Hyperlipemia   . Hypertension     Past Surgical History:  Procedure Laterality Date  . CERVICAL SPINE SURGERY    . ELBOW SURGERY    . UMBILICAL HERNIA REPAIR      Family History  Problem Relation Age of Onset  . Lung disease Father   . Asthma Mother     Social History   Tobacco Use  . Smoking status: Former Smoker    Packs/day: 1.50    Types: Cigarettes    Quit date: 10/30/2016    Years since quitting: 2.6  . Smokeless tobacco: Never Used  Substance Use Topics  . Alcohol use: No    Alcohol/week: 0.0 standard drinks    Comment: "not since last year" 06/29/2017"  . Drug use: No    No current facility-administered medications for this encounter.   Current Outpatient Medications:  .  Candesartan Cilexetil-HCTZ 32-25 MG TABS, Take 1 tablet by mouth daily., Disp: , Rfl:  .  ketoconazole (NIZORAL) 2 % cream, Apply 1 application topically 2 (two) times daily. Apply daily for 4 weeks and for 1 week after lesion has healed, Disp: 30 g, Rfl: 1 .  metoprolol succinate (TOPROL-XL) 100 MG 24 hr tablet, Take 1 tablet (100 mg total) by mouth daily. Take with or immediately following a meal., Disp: 30 tablet, Rfl: 0 .  nortriptyline (PAMELOR) 50 MG capsule, Take 1 capsule (50 mg total) by mouth at bedtime., Disp: 30 capsule, Rfl: 5 .   omeprazole (PRILOSEC) 40 MG capsule, Take 1 capsule by mouth daily., Disp: , Rfl:  .  simvastatin (ZOCOR) 10 MG tablet, Take 10 mg by mouth daily. , Disp: , Rfl:  .  tamsulosin (FLOMAX) 0.4 MG CAPS capsule, Take 0.4 mg by mouth daily after supper., Disp: , Rfl:  .  tiZANidine (ZANAFLEX) 2 MG tablet, Take 1 tablet (2 mg total) by mouth 3 (three) times daily. (Patient taking differently: Take 2 mg by mouth every 8 (eight) hours as needed for muscle spasms. ), Disp: 90 tablet, Rfl: 5  No Known Allergies   ROS  As noted in HPI.   Physical Exam  BP 135/87 (BP Location: Left Arm)   Pulse 88   Temp 98.1 F (36.7 C) (Oral)   Resp 17   SpO2 100%   Constitutional: Well developed, well nourished, no acute distress Eyes:  EOMI, conjunctiva normal bilaterally HENT: Normocephalic, atraumatic,mucus membranes moist Respiratory: Normal inspiratory effort Cardiovascular: Normal rate GI: nondistended skin: Flat 2 x 1 cm hyperpigmented brown macule top of left foot.  No tenderness, surrounding erythema, induration, crusting, increased temperature.  Several lesions on second toe.  Positive onychomycosis all nails.  No other rash.  See pictures.       Musculoskeletal: no deformities Neurologic: Alert &  oriented x 3, no focal neuro deficits Psychiatric: Speech and behavior appropriate   ED Course   Medications - No data to display  No orders of the defined types were placed in this encounter.   No results found for this or any previous visit (from the past 24 hour(s)). No results found.  ED Clinical Impression  1. Rash and nonspecific skin eruption      ED Assessment/Plan  We will try some topical antifungal creams as he does have onychomycosis and appears to have some form of dermal fungal infection between his toes.  This does not appear to be an infection requiring antibiotic therapy or a contact dermatitis.  Home with ketoconazole 2% for 4 weeks and for 1 week after lesion is  healed.  If not better in 2 to 4 weeks, he will follow-up with his podiatrist.  ER records reviewed.  Was seen several weeks ago for left upper abdominal/chest pain.  Other than a mild leukocytosis, all labs including BUN/creatinine were unremarkable.  Discussed  MDM, treatment plan, and plan for follow-up with patient.  patient agrees with plan.   Meds ordered this encounter  Medications  . ketoconazole (NIZORAL) 2 % cream    Sig: Apply 1 application topically 2 (two) times daily. Apply daily for 4 weeks and for 1 week after lesion has healed    Dispense:  30 g    Refill:  1    *This clinic note was created using [employer]. Therefore, there may be occasional mistakes despite careful proofreading.   ?   Melynda Ripple, MD 06/11/19 2102

## 2019-06-11 NOTE — Discharge Instructions (Addendum)
Apply the ketoconazole twice a day for 4 weeks, or for 1 week after the lesions have healed.  Follow-up with your podiatrist if it is not resolved in 1 month.

## 2019-06-17 ENCOUNTER — Ambulatory Visit: Payer: BC Managed Care – PPO | Admitting: Neurology

## 2019-06-17 ENCOUNTER — Encounter: Payer: Self-pay | Admitting: Neurology

## 2019-06-17 ENCOUNTER — Other Ambulatory Visit: Payer: Self-pay

## 2019-06-17 VITALS — BP 121/80 | HR 84 | Temp 96.6°F | Ht 73.0 in | Wt 275.0 lb

## 2019-06-17 DIAGNOSIS — M5416 Radiculopathy, lumbar region: Secondary | ICD-10-CM

## 2019-06-17 DIAGNOSIS — M48062 Spinal stenosis, lumbar region with neurogenic claudication: Secondary | ICD-10-CM | POA: Diagnosis not present

## 2019-06-17 DIAGNOSIS — M4807 Spinal stenosis, lumbosacral region: Secondary | ICD-10-CM

## 2019-06-17 MED ORDER — GABAPENTIN 300 MG PO CAPS: ORAL_CAPSULE | ORAL | 11 refills | Status: DC

## 2019-06-17 MED ORDER — ALPRAZOLAM 0.25 MG PO TABS: ORAL_TABLET | ORAL | 0 refills | Status: DC

## 2019-06-17 NOTE — Patient Instructions (Signed)
Bloodwork Start Gabapentin for pain MRI lumbar spine  Gabapentin capsules or tablets What is this medicine? GABAPENTIN (GA ba pen tin) is used to control seizures in certain types of epilepsy. It is also used to treat certain types of nerve pain. This medicine may be used for other purposes; ask your health care provider or pharmacist if you have questions. COMMON BRAND NAME(S): Active-PAC with Gabapentin, Gabarone, Neurontin What should I tell my health care provider before I take this medicine? They need to know if you have any of these conditions:  history of drug abuse or alcohol abuse problem  kidney disease  lung or breathing disease  suicidal thoughts, plans, or attempt; a previous suicide attempt by you or a family member  an unusual or allergic reaction to gabapentin, other medicines, foods, dyes, or preservatives  pregnant or trying to get pregnant  breast-feeding How should I use this medicine? Take this medicine by mouth with a glass of water. Follow the directions on the prescription label. You can take it with or without food. If it upsets your stomach, take it with food. Take your medicine at regular intervals. Do not take it more often than directed. Do not stop taking except on your doctor's advice. If you are directed to break the 600 or 800 mg tablets in half as part of your dose, the extra half tablet should be used for the next dose. If you have not used the extra half tablet within 28 days, it should be thrown away. A special MedGuide will be given to you by the pharmacist with each prescription and refill. Be sure to read this information carefully each time. Talk to your pediatrician regarding the use of this medicine in children. While this drug may be prescribed for children as young as 3 years for selected conditions, precautions do apply. Overdosage: If you think you have taken too much of this medicine contact a poison control center or emergency room at  once. NOTE: This medicine is only for you. Do not share this medicine with others. What if I miss a dose? If you miss a dose, take it as soon as you can. If it is almost time for your next dose, take only that dose. Do not take double or extra doses. What may interact with this medicine? This medicine may interact with the following medications:  alcohol  antihistamines for allergy, cough, and cold  certain medicines for anxiety or sleep  certain medicines for depression like amitriptyline, fluoxetine, sertraline  certain medicines for seizures like phenobarbital, primidone  certain medicines for stomach problems  general anesthetics like halothane, isoflurane, methoxyflurane, propofol  local anesthetics like lidocaine, pramoxine, tetracaine  medicines that relax muscles for surgery  narcotic medicines for pain  phenothiazines like chlorpromazine, mesoridazine, prochlorperazine, thioridazine This list may not describe all possible interactions. Give your health care provider a list of all the medicines, herbs, non-prescription drugs, or dietary supplements you use. Also tell them if you smoke, drink alcohol, or use illegal drugs. Some items may interact with your medicine. What should I watch for while using this medicine? Visit your doctor or health care provider for regular checks on your progress. You may want to keep a record at home of how you feel your condition is responding to treatment. You may want to share this information with your doctor or health care provider at each visit. You should contact your doctor or health care provider if your seizures get worse or if you have  any new types of seizures. Do not stop taking this medicine or any of your seizure medicines unless instructed by your doctor or health care provider. Stopping your medicine suddenly can increase your seizures or their severity. This medicine may cause serious skin reactions. They can happen weeks to  months after starting the medicine. Contact your health care provider right away if you notice fevers or flu-like symptoms with a rash. The rash may be red or purple and then turn into blisters or peeling of the skin. Or, you might notice a red rash with swelling of the face, lips or lymph nodes in your neck or under your arms. Wear a medical identification bracelet or chain if you are taking this medicine for seizures, and carry a card that lists all your medications. You may get drowsy, dizzy, or have blurred vision. Do not drive, use machinery, or do anything that needs mental alertness until you know how this medicine affects you. To reduce dizzy or fainting spells, do not sit or stand up quickly, especially if you are an older patient. Alcohol can increase drowsiness and dizziness. Avoid alcoholic drinks. Your mouth may get dry. Chewing sugarless gum or sucking hard candy, and drinking plenty of water will help. The use of this medicine may increase the chance of suicidal thoughts or actions. Pay special attention to how you are responding while on this medicine. Any worsening of mood, or thoughts of suicide or dying should be reported to your health care provider right away. Women who become pregnant while using this medicine may enroll in the [place] Pregnancy Registry by calling 3184950726. This registry collects information about the safety of antiepileptic drug use during pregnancy. What side effects may I notice from receiving this medicine? Side effects that you should report to your doctor or health care professional as soon as possible:  allergic reactions like skin rash, itching or hives, swelling of the face, lips, or tongue  breathing problems  rash, fever, and swollen lymph nodes  redness, blistering, peeling or loosening of the skin, including inside the mouth  suicidal thoughts, mood changes Side effects that usually do not require medical attention  (report to your doctor or health care professional if they continue or are bothersome):  dizziness  drowsiness  headache  nausea, vomiting  swelling of ankles, feet, hands  tiredness This list may not describe all possible side effects. Call your doctor for medical advice about side effects. You may report side effects to FDA at 1-800-FDA-1088. Where should I keep my medicine? Keep out of reach of children. This medicine may cause accidental overdose and death if it taken by other adults, children, or pets. Mix any unused medicine with a substance like cat litter or coffee grounds. Then throw the medicine away in a sealed container like a sealed bag or a coffee can with a lid. Do not use the medicine after the expiration date. Store at room temperature between 15 and 30 degrees C (59 and 86 degrees F). NOTE: This sheet is a summary. It may not cover all possible information. If you have questions about this medicine, talk to your doctor, pharmacist, or health care provider.  2020 Elsevier/Gold Standard (2019-02-22 14:16:43)

## 2019-06-17 NOTE — Progress Notes (Signed)
[facility]    Provider:  Dr Lucia GaskinsAhern Requesting Provider: Wilmer Floorampbell, Stephen D., MD Primary Care Provider:  Wilmer Floorampbell, Stephen D., MD  CC:  Left leg weakness and pain  HPI:  Michael Whitaker is a 63 y.o. male here as requested by Wilmer Floorampbell, Stephen D., MD for lumbar radiculopathy.  Past medical history hypertension, osteoarthritis, obesity, sedentary lifestyle, tobacco use disorder, hyperlipidemia, prior sciatica, herniated disc, previous back injury and degenerative joint disease. Hurting for several years, recently worsening. His left leg hurts, it throbs. 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 He feels the left leg is getting weak. No bowel or bladder loss. His left leg has numbness and is painful. His leg feels numb. Left leg hurts more than the right and comes and goes but overall is progressively worsening. 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 He has a history of cervical spinal degenerative disease and surgery.Chronic neck pain. No other focal neurologic deficits, associated symptoms, inciting events or modifiable factors.  Reviewed notes, labs and imaging from outside physicians, which showed:  I reviewed Dr. Blair Dolphinampbell's notes.  The patient is a 63 year old male who presents with acute sciatica on May 06, 2019.  Symptoms include sciatic pain, paresthesias and numbness, sciatic pain is located in the left lateral thigh, tingling, exacerbated by sitting.  Relieved by activity modification.  No fecal incontinence or urinary retention.  Pertinent medical history includes prior sciatica, herniated disc, previous back injury and degenerative joint disease.  Risk factors include obesity and sedentary lifestyle.  The onset of the leg numbness has been gradual following no specific incident about a month prior to his visit in June 1 the leg numbness is described as needles of pins numbness and  tingling.  Located in the left thigh posteriorly anteriorly and left calf.  Per notes he is on nortriptyline and celecoxib.  Symptoms are in the left buttocks radiating to the left thigh and lower leg.  Physical exam was normal.    Review of Systems: Patient complains of symptoms per HPI as well as the following symptoms: join tpain, aching musclea. Pertinent negatives and positives per HPI. All others negative.   Social History   Socioeconomic History   Marital status: Married    Spouse name: Not on file   Number of children: 2   Years of education: Not on file   Highest education level: Not on file  Occupational History   Not on file  Social Needs   Financial resource strain: Not on file   Food insecurity    Worry: Not on file    Inability: Not on file   Transportation needs    Medical: Not on file    Non-medical: Not on file  Tobacco Use   Smoking status: Former Smoker    Packs/day: 1.50    Types: Cigarettes    Quit date: 10/30/2016    Years since quitting: 2.6   Smokeless tobacco: Never Used  Substance and Sexual Activity   Alcohol use: Not Currently    Alcohol/week: 0.0 standard drinks    Comment: "not since last year" 06/29/2017". one beer on 4th of july 2020.   Drug use: No   Sexual activity: Not on file  Lifestyle   Physical activity    Days per week: Not on file    Minutes per session: Not on file   Stress: Not on file  Relationships   Social Herbalist on phone: Not on file    Gets together: Not on file    Attends religious service: Not on file    Active member of club or organization: Not on file    Attends meetings of clubs or organizations: Not on file    Relationship status: Not on file   Intimate partner violence    Fear of current or ex partner: Not on file    Emotionally abused: Not on file    Physically abused: Not on file    Forced sexual activity: Not on file  Other Topics Concern   Not on file  Social History  Narrative   Lives alone.  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     celecoxib (CELEBREX) 200 MG capsule Take 200 mg by mouth 2 (two) times daily as needed.     ketoconazole (NIZORAL) 2 % cream Apply 1 application topically 2 (two) times daily. Apply daily for 4 weeks and for 1 week after lesion has healed 30 g 1   metoprolol succinate (TOPROL-XL) 100 MG 24 hr tablet Take 1 tablet (100 mg total) by mouth daily. Take with or immediately following a meal. 30 tablet 0   nortriptyline (PAMELOR) 50 MG capsule Take 1 capsule (50 mg total) by mouth at bedtime. 30 capsule 5   omeprazole (PRILOSEC) 40 MG capsule Take 1 capsule by mouth daily.     simvastatin (ZOCOR) 10 MG tablet Take 10 mg by mouth daily.      tamsulosin (FLOMAX) 0.4 MG CAPS capsule Take 0.4 mg by mouth daily after supper.     tiZANidine (ZANAFLEX) 2 MG tablet Take 1 tablet (2 mg total) by mouth 3 (three) times daily. (Patient taking differently: Take 2 mg by mouth every  8 (eight) hours as needed for muscle spasms. ) 90 tablet 5   ALPRAZolam (XANAX) 0.25 MG tablet Take 1-2 tabs (0.25mg -0.50mg ) 30-60 minutes before procedure. May repeat if needed.Do not drive. 4 tablet 0   gabapentin (NEURONTIN) 300 MG capsule take1-2 capsules up to three times a day. 180 capsule 11   No current facility-administered medications for this visit.     Allergies as of 06/17/2019   (No Known Allergies)    Vitals: BP 121/80 (BP Location: Left Arm, Patient Position: Sitting)    Pulse 84    Temp (!) 96.6 F (35.9 C) Comment: taken by front staff  Ht 6\' 1"  (1.854 m)    Wt 275 lb (124.7 kg)    BMI 36.28 kg/m  Last Weight:  Wt Readings from Last 1 Encounters:  06/17/19 275 lb (124.7 kg)   Last Height:   Ht Readings from Last 1 Encounters:  06/17/19 6\' 1"  (1.854 m)     Physical exam: Exam: Gen: NAD, conversant                    CV: RRR, no MRG. No Carotid Bruits. No peripheral edema, warm, nontender Eyes: Conjunctivae clear without exudates or hemorrhage  Neuro: Detailed Neurologic Exam  Speech:    Speech is normal; fluent and spontaneous with normal comprehension.  Cognition:    The patient is oriented to person, place, and time;     recent and remote memory intact;     language fluent;     normal attention, concentration,     fund of knowledge Cranial Nerves:    The pupils are equal, round, and reactive to light.attempted fundoscopy could not visualize due to small pupils.  Visual fields are full to finger confrontation. Extraocular movements are intact. Trigeminal sensation is intact and the muscles of mastication are normal. The face is symmetric. The palate elevates in the midline. Hearing intact. Voice is normal. Shoulder shrug is normal. The tongue has normal motion without fasciculations.   Coordination:    Normal finger to nose and heel to shin. Normal rapid alternating movements.   Gait:    antalgic  Motor Observation:    No asymmetry, no  atrophy, and no involuntary movements noted. Tone:    Normal muscle tone.    Posture:    stooped    Strength:Left leg prox weakness and giveway of hip flexion, left leg flexion 4/5. Otherwise    Strength is V/V in the upper and lower limbs.      Sensation: intact to LT     Reflex Exam:  DTR's:    Absent AJ. Deep tendon reflexes in the upper and lower extremities are normal bilaterally.   Toes:    The toes are downgoing bilaterally.   Clonus:    Clonus is absent.    Assessment/Plan:  63 y.o. male here as requested by Wilmer Floorampbell, Stephen D., MD for lumbar radiculopathy.  Past medical history hypertension, osteoarthritis, obesity, sedentary lifestyle, tobacco use disorder, hyperlipidemia, prior sciatica, herniated disc, previous back injury and degenerative joint disease.  He has been under the care of his primary care physician for over 3 months, tried conservative measures including oral anti-inflammatories, nortriptyline, heat, ice, stretching, muscle relaxer.  No improvement.  Patient needs MRI of the lumbar spine. He has weakness of the left leg, also neurogenic claudication symptoms.  He was on gabapentin 300mg  tid no side effects but did not help, will increase to up to 600mg  tid.   Orders Placed This Encounter  Procedures   MR LUMBAR SPINE WO CONTRAST   Basic Metabolic Panel   Meds ordered this encounter  Medications   ALPRAZolam (XANAX) 0.25 MG tablet    Sig: Take 1-2 tabs (0.25mg -0.50mg ) 30-60 minutes before procedure. May repeat if needed.Do not drive.    Dispense:  4 tablet    Refill:  0   gabapentin (NEURONTIN) 300 MG capsule    Sig: take1-2 capsules up to three times a day.    Dispense:  180 capsule    Refill:  11    Cc: Wilmer Floorampbell, Stephen D., MD  Naomie DeanAntonia Kaesyn Johnston, MD  [facility] [address] [place], [place] [address]  Phone [phone] Fax [phone]

## 2019-06-18 LAB — BASIC METABOLIC PANEL
BUN/Creatinine Ratio: 17 (ref 10–24)
BUN: 15 mg/dL (ref 8–27)
CO2: 25 mmol/L (ref 20–29)
Calcium: 8.8 mg/dL (ref 8.6–10.2)
Chloride: 103 mmol/L (ref 96–106)
Creatinine, Ser: 0.86 mg/dL (ref 0.76–1.27)
GFR calc Af Amer: 109 mL/min/{1.73_m2} (ref >59)
GFR calc non Af Amer: 94 mL/min/{1.73_m2} (ref >59)
Glucose: 83 mg/dL (ref 65–99)
Potassium: 4.6 mmol/L (ref 3.5–5.2)
Sodium: 142 mmol/L (ref 134–144)

## 2019-06-19 ENCOUNTER — Telehealth: Payer: Self-pay | Admitting: *Deleted

## 2019-06-19 ENCOUNTER — Telehealth: Payer: Self-pay | Admitting: Neurology

## 2019-06-19 NOTE — Telephone Encounter (Signed)
-----   Message from Melvenia Beam, MD sent at 06/18/2019 11:22 AM EDT ----- Labs normal thanks

## 2019-06-19 NOTE — Telephone Encounter (Signed)
Spoke with pt and advised of normal labs. He vebralized understanding and stated his MRI is next Wednesday 7/22 here at [facility]. He verbalized appreciation for the call.

## 2019-06-19 NOTE — Telephone Encounter (Signed)
LVM for pt to call back about scheduling mri  [employer] Auth: [id number] (exp. 06/19/19 to 12/15/19)

## 2019-06-19 NOTE — Telephone Encounter (Signed)
I spoke to the patient he is scheduled for 06/26/19 at [facility].

## 2019-06-26 ENCOUNTER — Ambulatory Visit: Payer: BC Managed Care – PPO

## 2019-06-26 ENCOUNTER — Other Ambulatory Visit: Payer: Self-pay

## 2019-06-26 DIAGNOSIS — M5416 Radiculopathy, lumbar region: Secondary | ICD-10-CM

## 2019-06-26 DIAGNOSIS — M48062 Spinal stenosis, lumbar region with neurogenic claudication: Secondary | ICD-10-CM | POA: Diagnosis not present

## 2019-06-26 DIAGNOSIS — M4807 Spinal stenosis, lumbosacral region: Secondary | ICD-10-CM

## 2019-06-27 ENCOUNTER — Telehealth: Payer: Self-pay | Admitting: *Deleted

## 2019-06-27 NOTE — Telephone Encounter (Signed)
LVM requesting call back for MRI results. 

## 2019-07-01 ENCOUNTER — Telehealth: Payer: Self-pay | Admitting: Neurology

## 2019-07-01 ENCOUNTER — Telehealth: Payer: Self-pay

## 2019-07-01 DIAGNOSIS — M5416 Radiculopathy, lumbar region: Secondary | ICD-10-CM

## 2019-07-01 NOTE — Telephone Encounter (Signed)
Pt came by the office and I was able to discuss MRI results. He was agreeable to neurosurgery referral. Pt did not have a specific office or provider in mind.

## 2019-07-01 NOTE — Progress Notes (Signed)
See telephone note from 07-01-2019

## 2019-07-01 NOTE — Telephone Encounter (Signed)
Pt called back on Friday and lm on vm wanting concerning MRI you can reach pt at the home#

## 2019-07-01 NOTE — Telephone Encounter (Signed)
Lvm asking pt to call back so we could discuss MRI results.

## 2019-07-08 ENCOUNTER — Ambulatory Visit: Payer: BLUE CROSS/BLUE SHIELD | Admitting: Neurology

## 2019-07-12 DIAGNOSIS — I10 Essential (primary) hypertension: Secondary | ICD-10-CM | POA: Diagnosis not present

## 2019-07-12 DIAGNOSIS — M5416 Radiculopathy, lumbar region: Secondary | ICD-10-CM | POA: Diagnosis not present

## 2019-07-12 DIAGNOSIS — M7070 Other bursitis of hip, unspecified hip: Secondary | ICD-10-CM | POA: Diagnosis not present

## 2019-07-12 DIAGNOSIS — Z6835 Body mass index (BMI) 35.0-35.9, adult: Secondary | ICD-10-CM | POA: Diagnosis not present

## 2019-07-15 ENCOUNTER — Other Ambulatory Visit: Payer: Self-pay | Admitting: Neurological Surgery

## 2019-07-15 DIAGNOSIS — M7070 Other bursitis of hip, unspecified hip: Secondary | ICD-10-CM

## 2019-07-18 ENCOUNTER — Other Ambulatory Visit: Payer: Self-pay

## 2019-07-18 ENCOUNTER — Ambulatory Visit
Admission: RE | Admit: 2019-07-18 | Discharge: 2019-07-18 | Disposition: A | Discharge status: Home / Self Care | Payer: BC Managed Care – PPO | Source: Ambulatory Visit | Attending: Neurological Surgery | Admitting: Neurological Surgery

## 2019-07-18 DIAGNOSIS — M7070 Other bursitis of hip, unspecified hip: Secondary | ICD-10-CM

## 2019-07-18 DIAGNOSIS — M7062 Trochanteric bursitis, left hip: Secondary | ICD-10-CM | POA: Diagnosis not present

## 2019-07-18 MED ORDER — IOPAMIDOL (ISOVUE-M 200) INJECTION 41%
1.0000 mL | Freq: Once | INTRAMUSCULAR | Status: AC
  Administered 2019-07-18: 1 mL

## 2019-07-18 MED ORDER — METHYLPREDNISOLONE ACETATE 40 MG/ML INJ SUSP (RADIOLOG
120.0000 mg | Freq: Once | INTRAMUSCULAR | Status: AC
  Administered 2019-07-18: 14:00:00 120 mg via INTRALESIONAL

## 2019-08-22 DIAGNOSIS — M159 Polyosteoarthritis, unspecified: Secondary | ICD-10-CM | POA: Diagnosis not present

## 2019-08-22 DIAGNOSIS — E785 Hyperlipidemia, unspecified: Secondary | ICD-10-CM | POA: Diagnosis not present

## 2019-08-22 DIAGNOSIS — K219 Gastro-esophageal reflux disease without esophagitis: Secondary | ICD-10-CM | POA: Diagnosis not present

## 2019-08-22 DIAGNOSIS — I10 Essential (primary) hypertension: Secondary | ICD-10-CM | POA: Diagnosis not present

## 2019-09-11 DIAGNOSIS — Z2821 Immunization not carried out because of patient refusal: Secondary | ICD-10-CM | POA: Diagnosis not present

## 2019-09-11 DIAGNOSIS — Z6836 Body mass index (BMI) 36.0-36.9, adult: Secondary | ICD-10-CM | POA: Diagnosis not present

## 2019-09-11 DIAGNOSIS — M5416 Radiculopathy, lumbar region: Secondary | ICD-10-CM | POA: Diagnosis not present

## 2019-09-11 DIAGNOSIS — Z79899 Other long term (current) drug therapy: Secondary | ICD-10-CM | POA: Diagnosis not present

## 2019-09-20 ENCOUNTER — Other Ambulatory Visit: Payer: Self-pay | Admitting: Neurology

## 2019-09-20 ENCOUNTER — Telehealth: Payer: Self-pay | Admitting: Neurology

## 2019-09-20 NOTE — Telephone Encounter (Signed)
Script had been sent in earlier today to pharmacy on file - left message for patient that it has been sent in.

## 2019-09-20 NOTE — Telephone Encounter (Signed)
Patient is out of medication and is wanting a refill on the tizanidine to the pharm on file 90 day supply. Thanks!

## 2019-09-20 NOTE — Telephone Encounter (Signed)
Requested Prescriptions   Pending Prescriptions Disp Refills  . tiZANidine (ZANAFLEX) 2 MG tablet [Pharmacy Med Name: tiZANidine HCl 2 MG Oral Tablet] 90 tablet 0    Sig: TAKE 1 TABLET BY MOUTH THREE TIMES DAILY   Rx last filled:12/31/18 #90 5 refills  Pt last seen:12/31/18  Follow up appt scheduled:10/24/19

## 2019-10-22 NOTE — Progress Notes (Signed)
NEUROLOGY FOLLOW UP OFFICE NOTE  Michael Whitaker [id number]  HISTORY OF PRESENT ILLNESS: Michael Whitaker is a 63 year old right-handed male with hypertension, hyperlipidemia and GERD who follows up for cervicogenic headache and cervicalgia.  UPDATE: Intensity:  moderate Duration:  10-15 minutes Frequency:  Once a week  Current NSAIDS:  Celecoxib 200 mg twice daily as needed Current analgesics: None Current triptans: None Current ergotamine: None Current anti-emetic: None Current muscle relaxants: Tizanidine 2 mg three times daily for neck pain Current anti-anxiolytic: None Current sleep aide: None Current Antihypertensive medications: Lisinopril, Toprol-XL Current Antidepressant medications: Nortriptyline 50 mg at bedtime (only once in awhile) Current Anticonvulsant medications: None Current anti-CGRP: None Current Vitamins/Herbal/Supplements: None Current Antihistamines/Decongestants: None Other therapy: None  HISTORY: Onset:  In 2017, he developed left sided radicular neck pain. 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 In July 2017, he underwent cervical laminectomy with posterior spinal fusion a C3-4, C4-5 and C5-6 due to neck and left sided radicular pain. Following surgery, he has had neck and posterior head pain. Cervical X-rays on 10/14/16 demonstrated adequate hardware without evidence of loosening. Location: Occipital region, radiating into neck bilaterally Quality: throbbing Initial Intensity: 3-4/10 Aura: no Prodrome: no Postdrome: no Associated symptoms:  None. No nausea, vomiting, photophobia, phonophobia or visual disturbance. He has not had any new worse headache of his life, or waking up from sleep Initial Duration: Constantly Initial Frequency: daily Initial Frequency of  abortive medication: daily Aggravating factors:  Neck movement Relieving factors: Tylenol or Advil Activity: Functions. Limited neck movement due to tightness and pain. Still has pain radiating down both shoulders. No arm/hand weakness or numbness.  Past NSAIDS: Advil Past analgesics: Tramadol, Tylenol Past abortive triptans: no Past muscle relaxants: Yes (does not remember which one but it caused drowsiness) Past antiepileptic: Gabapentin (ineffective) Other past therapies: no  PAST MEDICAL HISTORY: Past Medical History:  Diagnosis Date  . GERD (gastroesophageal reflux disease)   . Hyperlipemia   . Hypertension     MEDICATIONS: Current Outpatient Medications on File Prior to Visit  Medication Sig Dispense Refill  . ALPRAZolam (XANAX) 0.25 MG tablet Take 1-2 tabs (0.25mg -0.50mg ) 30-60 minutes before procedure. May repeat if needed.Do not drive. 4 tablet 0  . Candesartan Cilexetil-HCTZ 32-25 MG TABS Take 1 tablet by mouth daily.    . celecoxib (CELEBREX) 200 MG capsule Take 200 mg by mouth 2 (two) times daily as needed.    . gabapentin (NEURONTIN) 300 MG capsule take1-2 capsules up to three times a day. 180 capsule 11  . ketoconazole (NIZORAL) 2 % cream Apply 1 application topically 2 (two) times daily. Apply daily for 4 weeks and for 1 week after lesion has healed 30 g 1  . metoprolol succinate (TOPROL-XL) 100 MG 24 hr tablet Take 1 tablet (100 mg total) by mouth daily. Take with or immediately following a meal. 30 tablet 0  . nortriptyline (PAMELOR) 50 MG capsule Take 1 capsule (50 mg total) by mouth at bedtime. 30 capsule 5  . omeprazole (PRILOSEC) 40 MG capsule Take 1 capsule by mouth daily.    . simvastatin (ZOCOR) 10 MG tablet Take 10 mg by mouth daily.     . tamsulosin (FLOMAX) 0.4 MG CAPS capsule Take 0.4 mg by mouth daily after supper.    Marland Kitchen tiZANidine (ZANAFLEX) 2 MG tablet TAKE 1 TABLET BY MOUTH THREE  TIMES DAILY 90 tablet 0   No current facility-administered  medications on file prior to visit.     ALLERGIES: No Known Allergies  FAMILY HISTORY: Family History  Problem Relation Age of Onset  . Lung disease Father   . Asthma Mother   . Neuropathy Neg Hx    SOCIAL HISTORY: Social History   Socioeconomic History  . Marital status: Married    Spouse name: Not on file  . Number of children: 2  . Years of education: Not on file  . Highest education level: Not on file  Occupational History  . Not on file  Social Needs  . Financial resource strain: Not on file  . Food insecurity    Worry: Not on file    Inability: Not on file  . Transportation needs    Medical: Not on file    Non-medical: Not on file  Tobacco Use  . Smoking status: Former Smoker    Packs/day: 1.50    Types: Cigarettes    Quit date: 10/30/2016    Years since quitting: 2.9  . Smokeless tobacco: Never Used  Substance and Sexual Activity  . Alcohol use: Not Currently    Alcohol/week: 0.0 standard drinks    Comment: "not since last year" 06/29/2017". one beer on 4th of july 2020.  Marland Kitchen Drug use: No  . Sexual activity: Not on file  Lifestyle  . Physical activity    Days per week: Not on file    Minutes per session: Not on file  . Stress: Not on file  Relationships  . Social Musician on phone: Not on file    Gets together: Not on file    Attends religious service: Not on file    Active member of club or organization: Not on file    Attends meetings of clubs or organizations: Not on file    Relationship status: Not on file  . Intimate partner violence    Fear of current or ex partner: Not on file    Emotionally abused: Not on file    Physically abused: Not on file    Forced sexual activity: Not on file  Other Topics Concern  . 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    REVIEW OF SYSTEMS: Constitutional: No fevers, chills, or sweats, no generalized fatigue, change in appetite Eyes: No visual  changes, double vision, eye pain Ear, nose and throat: No hearing loss, ear pain, nasal congestion, sore throat Cardiovascular: No chest pain, palpitations Respiratory:  No shortness of breath at rest or with exertion, wheezes GastrointestinaI: No nausea, vomiting, diarrhea, abdominal pain, fecal incontinence Genitourinary:  No dysuria, urinary retention or frequency Musculoskeletal:  Neck pain, back pain Integumentary: No rash, pruritus, skin lesions Neurological: as above Psychiatric: No depression, insomnia, anxiety Endocrine: No palpitations, fatigue, diaphoresis, mood swings, change in appetite, change in weight, increased thirst Hematologic/Lymphatic:  No purpura, petechiae. Allergic/Immunologic: no itchy/runny eyes, nasal congestion, recent allergic reactions, rashes  PHYSICAL EXAM: Blood pressure 126/85, pulse 69, height 6\' 1"  (1.854 m), weight 281 lb 3.2 oz (127.6 kg), SpO2 98 %. General: No acute distress.  Patient appears well-groomed.   Head:  Normocephalic/atraumatic Eyes:  Fundi examined but not visualized Neck: supple, no paraspinal tenderness, full range of motion Heart:  Regular rate and rhythm Lungs:  Clear to auscultation bilaterally Back: No paraspinal tenderness Neurological Exam: alert and oriented to person, place, and  time. Attention span and concentration intact, recent and remote memory intact, fund of knowledge intact.  Speech fluent and not dysarthric, language intact.  CN II-XII intact. Bulk and tone normal, muscle strength 5/5 throughout.  Sensation to light touch, temperature and vibration intact.  Deep tendon reflexes 2+ throughout, toes downgoing.  Finger to nose and heel to shin testing intact.  Gait normal, Romberg negative.  IMPRESSION: Cervicogenic headache/chronic tension-type headache, not intractable  PLAN: 1.  Nortriptyline 50mg  at bedtime 2.  Tizanidine 2mg  three times daily 3.  Limit use of pain relievers to no more than 2 days out of week to  prevent risk of rebound or medication-overuse headache. 4.  Keep headache diary 5.  Follow up in one year  25 minutes spent face to face with patient, over 50% spent discussing management.  Shon MilletAdam , DO  CC: Michael DresserStephen Campbell, MD

## 2019-10-24 ENCOUNTER — Ambulatory Visit: Payer: BC Managed Care – PPO | Admitting: Neurology

## 2019-10-24 ENCOUNTER — Other Ambulatory Visit: Payer: Self-pay

## 2019-10-24 ENCOUNTER — Encounter: Payer: Self-pay | Admitting: Neurology

## 2019-10-24 VITALS — BP 126/85 | HR 69 | Ht 73.0 in | Wt 281.2 lb

## 2019-10-24 DIAGNOSIS — G44229 Chronic tension-type headache, not intractable: Secondary | ICD-10-CM

## 2019-10-24 DIAGNOSIS — R519 Headache, unspecified: Secondary | ICD-10-CM

## 2019-10-24 DIAGNOSIS — G4486 Cervicogenic headache: Secondary | ICD-10-CM

## 2019-10-24 NOTE — Patient Instructions (Addendum)
1.  Continue taking nortriptyline 50mg  at bedtime 2.  Take tizanidine 2mg  three times daily 3.  Limit use of pain relievers to no more than 2 days out of week to prevent risk of rebound or medication-overuse headache. 4.  Follow up in one year

## 2019-10-30 ENCOUNTER — Other Ambulatory Visit: Payer: Self-pay | Admitting: Neurology

## 2019-11-04 ENCOUNTER — Other Ambulatory Visit: Payer: Self-pay

## 2019-11-04 MED ORDER — TIZANIDINE HCL 2 MG PO TABS: 2.0000 mg | ORAL_TABLET | Freq: Three times a day (TID) | ORAL | 3 refills | Status: DC

## 2019-11-18 DIAGNOSIS — R519 Headache, unspecified: Secondary | ICD-10-CM | POA: Diagnosis not present

## 2019-11-18 DIAGNOSIS — Z6835 Body mass index (BMI) 35.0-35.9, adult: Secondary | ICD-10-CM | POA: Diagnosis not present

## 2019-11-18 DIAGNOSIS — Z79899 Other long term (current) drug therapy: Secondary | ICD-10-CM | POA: Diagnosis not present

## 2019-12-01 ENCOUNTER — Other Ambulatory Visit: Payer: Self-pay | Admitting: Neurology

## 2019-12-02 ENCOUNTER — Other Ambulatory Visit: Payer: Self-pay

## 2019-12-02 MED ORDER — TIZANIDINE HCL 2 MG PO TABS: 2.0000 mg | ORAL_TABLET | Freq: Three times a day (TID) | ORAL | 0 refills | Status: DC

## 2019-12-08 ENCOUNTER — Other Ambulatory Visit: Payer: Self-pay

## 2019-12-08 ENCOUNTER — Emergency Department (HOSPITAL_COMMUNITY)
Admission: EM | Admit: 2019-12-08 | Discharge: 2019-12-08 | Disposition: A | Discharge status: Home / Self Care | Payer: Self-pay | Attending: Emergency Medicine | Admitting: Emergency Medicine

## 2019-12-08 ENCOUNTER — Ambulatory Visit
Admission: EM | Admit: 2019-12-08 | Discharge: 2019-12-08 | Disposition: A | Discharge status: Home / Self Care | Payer: BC Managed Care – PPO

## 2019-12-08 ENCOUNTER — Encounter: Payer: Self-pay | Admitting: Emergency Medicine

## 2019-12-08 ENCOUNTER — Encounter (HOSPITAL_COMMUNITY): Payer: Self-pay | Admitting: Emergency Medicine

## 2019-12-08 DIAGNOSIS — S81802A Unspecified open wound, left lower leg, initial encounter: Secondary | ICD-10-CM

## 2019-12-08 DIAGNOSIS — Y939 Activity, unspecified: Secondary | ICD-10-CM | POA: Insufficient documentation

## 2019-12-08 DIAGNOSIS — Y999 Unspecified external cause status: Secondary | ICD-10-CM | POA: Insufficient documentation

## 2019-12-08 DIAGNOSIS — Z79899 Other long term (current) drug therapy: Secondary | ICD-10-CM | POA: Insufficient documentation

## 2019-12-08 DIAGNOSIS — S81801A Unspecified open wound, right lower leg, initial encounter: Secondary | ICD-10-CM | POA: Insufficient documentation

## 2019-12-08 DIAGNOSIS — X58XXXA Exposure to other specified factors, initial encounter: Secondary | ICD-10-CM | POA: Insufficient documentation

## 2019-12-08 DIAGNOSIS — Y929 Unspecified place or not applicable: Secondary | ICD-10-CM | POA: Insufficient documentation

## 2019-12-08 DIAGNOSIS — J449 Chronic obstructive pulmonary disease, unspecified: Secondary | ICD-10-CM | POA: Insufficient documentation

## 2019-12-08 DIAGNOSIS — I83891 Varicose veins of right lower extremities with other complications: Secondary | ICD-10-CM | POA: Diagnosis not present

## 2019-12-08 DIAGNOSIS — I10 Essential (primary) hypertension: Secondary | ICD-10-CM | POA: Insufficient documentation

## 2019-12-08 DIAGNOSIS — Z87891 Personal history of nicotine dependence: Secondary | ICD-10-CM | POA: Insufficient documentation

## 2019-12-08 NOTE — ED Provider Notes (Addendum)
[name]    CSN: [id number] Arrival date & time: 12/08/19  1201      History   Chief Complaint Chief Complaint  Patient presents with  . leg wound    HPI Michael Whitaker is a 64 y.o. male history hypertension presenting for right lower leg bleeding.  States it began bleeding spontaneously when he was putting socks on.  Does have varicose veins, denies previous trauma to the area.  EMS came back to his house 30 to 45 minutes PTA and applied pressure dressing.  Patient denies anticoagulant, blood thinner use, pain.   Past Medical History:  Diagnosis Date  . GERD (gastroesophageal reflux disease)   . Hyperlipemia   . Hypertension     Patient Active Problem List   Diagnosis Date Noted  . HTN (hypertension) 03/26/2015  . Tobacco abuse 03/26/2015  . Trigger finger, acquired 01/27/2015  . Fracture of fifth metacarpal bone of left hand 01/27/2015  . 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 Mild chronic obstructive pulmonary disease (HCC) 10/06/2011  . History of gastroesophageal reflux (GERD) 10/06/2011  . Osteoarthrosis, unspecified whether generalized or localized, upper arm 09/22/2011    Past Surgical History:  Procedure Laterality Date  . CERVICAL SPINE SURGERY    . ELBOW SURGERY    . UMBILICAL HERNIA REPAIR         Home Medications    Prior to Admission medications   Medication Sig Start Date End Date Taking? Authorizing Provider  ALPRAZolam (XANAX) 0.25 MG tablet Take 1-2 tabs (0.25mg -0.50mg ) 30-60 minutes before procedure. May repeat if needed.Do not drive. 06/17/19   Anson Fret, MD  Candesartan Cilexetil-HCTZ 32-25 MG TABS Take 1 tablet by mouth daily. 12/10/18   [provider]  celecoxib (CELEBREX) 200 MG capsule Take 200 mg by mouth 2 (two) times daily as needed.    [provider]  gabapentin (NEURONTIN) 300 MG capsule take1-2 capsules up to three times a day. 06/17/19   Anson Fret, MD  ketoconazole (NIZORAL) 2 % cream  Apply 1 application topically 2 (two) times daily. Apply daily for 4 weeks and for 1 week after lesion has healed 06/11/19   Domenick Gong, MD  metoprolol succinate (TOPROL-XL) 100 MG 24 hr tablet Take 1 tablet (100 mg total) by mouth daily. Take with or immediately following a meal. 01/19/15   Gerhard Munch, MD  nortriptyline (PAMELOR) 50 MG capsule Take 1 capsule by mouth at bedtime 12/02/19   Drema Dallas, DO  omeprazole (PRILOSEC) 40 MG capsule Take 1 capsule by mouth daily. 12/30/18   [provider]  simvastatin (ZOCOR) 10 MG tablet Take 10 mg by mouth daily.  03/18/15   [provider]  tamsulosin (FLOMAX) 0.4 MG CAPS capsule Take 0.4 mg by mouth daily after supper. 08/10/17   [provider]  tiZANidine (ZANAFLEX) 2 MG tablet Take 1 tablet (2 mg total) by mouth 3 (three) times daily. 12/02/19   Drema Dallas, DO    Family History Family History  Problem Relation Age of Onset  . Lung disease Father   . Asthma Mother   . Healthy Sister   . Healthy Brother   . Healthy Child   . Neuropathy Neg Hx     Social History Social History   Tobacco Use  . Smoking status: Former Smoker    Packs/day: 1.50    Types: Cigarettes    Quit date: 10/30/2016    Years since quitting: 3.1  . Smokeless tobacco:  Never Used  Substance Use Topics  . Alcohol use: Not Currently    Alcohol/week: 0.0 standard drinks    Comment: "not since last year" 06/29/2017". one beer on 4th of july 2020.  Marland Kitchen Drug use: No     Allergies   Patient has no known allergies.   Review of Systems Review of Systems  Constitutional: Negative for fatigue and fever.  Respiratory: Negative for cough and shortness of breath.   Cardiovascular: Negative for chest pain and palpitations.  Gastrointestinal: Negative for abdominal pain, diarrhea and vomiting.  Musculoskeletal: Negative for back pain and gait problem.  Skin: Positive for wound. Negative for rash.  Neurological: Negative for speech  difficulty and headaches.  All other systems reviewed and are negative.    Physical Exam Triage Vital Signs ED Triage Vitals  Enc Vitals Group     BP      Pulse      Resp      Temp      Temp src      SpO2      Weight      Height      Head Circumference      Peak Flow      Pain Score      Pain Loc      Pain Edu?      Excl. in [place]?    No data found.  Updated Vital Signs BP 116/77 (BP Location: Left Arm)   Pulse 82   Temp 98.4 F (36.9 C) (Oral)   Resp 18   SpO2 96%   Visual Acuity Right Eye Distance:   Left Eye Distance:   Bilateral Distance:    Right Eye Near:   Left Eye Near:    Bilateral Near:     Physical Exam Constitutional:      General: He is not in acute distress. HENT:     Head: Normocephalic and atraumatic.  Eyes:     General: No scleral icterus.    Pupils: Pupils are equal, round, and reactive to light.  Cardiovascular:     Rate and Rhythm: Normal rate.  Pulmonary:     Effort: Pulmonary effort is normal. No respiratory distress.     Breath sounds: No wheezing.  Skin:    Coloration: Skin is not jaundiced or pale.     Comments: Patient with punctate wound over right mid shin.  Patient does have numerous superficial varicosities.  When pressure dressing is removed blood shoots out from wound, away from leg (~3 cm).  Not pulsatile  Neurological:     Mental Status: He is alert and oriented to person, place, and time.      UC Treatments / Results  Labs (all labs ordered are listed, but only abnormal results are displayed) Labs Reviewed - No data to display  EKG   Radiology No results found.  Procedures Procedures (including critical care time)  Medications Ordered in UC Medications - No data to display  Initial Impression / Assessment and Plan / UC Course  I have reviewed the triage vital signs and the nursing notes.  Pertinent labs & imaging results that were available during my care of the patient were reviewed by me and  considered in my medical decision making (see chart for details).     Patient with active bleeding, concerning for small arterial.  Patient referred to ER for further management.  Pressure dressing applied by provider prior to discharge which he tolerated well.  Hemodynamically stable, not in  acute distress, sister will bring him. Final Clinical Impressions(s) / UC Diagnoses   Final diagnoses:  None     Discharge Instructions     Please go to ER for further evaluation of your actively bleeding leg.  Concern for arterial bleed.  Pressure dressing applied in office.    ED Prescriptions    None     PDMP not reviewed this encounter.   Hall-Potvin, [place], PA-C 12/08/19 1244    Hall-Potvin, [place], [place] 12/08/19 1245

## 2019-12-08 NOTE — Discharge Instructions (Addendum)
Please go to ER for further evaluation of your actively bleeding leg.  Concern for arterial bleed.  Pressure dressing applied in office.

## 2019-12-08 NOTE — ED Triage Notes (Signed)
Pt presents to [facility] for assessment of right leg bleeding spontaneously 30-45 minutes ago.  EMs came out and put a pressure dressing in place, which this RN left in place at this time until provider can assess.  This RN notes several varicose veins around the area.  Patient denies pain.

## 2019-12-08 NOTE — Discharge Instructions (Signed)
You may leave the dressing on for 2 days.  Please schedule a follow-up appointment with your primary care doctor.  After 2 days please change and remove the dressing.  If at any point you develop bleeding again please place gauze onto the wound and hold firm continuous pressure for 15 minutes.  Afterwards do not remove the gauze.  If this does not control your bleeding or it is a excessive amount of bleeding please seek additional medical care.

## 2019-12-08 NOTE — ED Triage Notes (Signed)
Sent from urgent care with bleeding from an injury in right lower leg-- pressure dressing placed, not bleeding at present.

## 2019-12-08 NOTE — ED Notes (Signed)
This RN and APP went in room to assess, and area is actively bleeding when pressure dressing removed.  Large amounts of blood with pressure behind it pour out, not pulsatile.

## 2019-12-08 NOTE — ED Provider Notes (Signed)
[facility] EMERGENCY DEPARTMENT Provider Note   CSN: [id number] Arrival date & time: 12/08/19  1257     History Chief Complaint  Patient presents with  . Leg Injury    Michael Whitaker is a 64 y.o. male with a past medical history of hypertension, tobacco abuse, COPD, who presents today for evaluation of right lower leg bleeding.  He reports that today he was putting socks on when it started spontaneously bleeding.  He thinks that he may have scratched himself with his nail.  He denies any anticoagulation use.  He reports his last tetanus shot was in the past 5 to 10 years.  Initially went to urgent care where there was concern for arterial bleeding as the blood reportedly shot out 3 cm from the wound, they applied a pressure dressing and referred him here.  HPI     Past Medical History:  Diagnosis Date  . GERD (gastroesophageal reflux disease)   . Hyperlipemia   . Hypertension     Patient Active Problem List   Diagnosis Date Noted  . HTN (hypertension) 03/26/2015  . Tobacco abuse 03/26/2015  . Trigger finger, acquired 01/27/2015  . Fracture of fifth metacarpal bone of left hand 01/27/2015  . 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 Mild chronic obstructive pulmonary disease (HCC) 10/06/2011  . History of gastroesophageal reflux (GERD) 10/06/2011  . Osteoarthrosis, unspecified whether generalized or localized, upper arm 09/22/2011    Past Surgical History:  Procedure Laterality Date  . CERVICAL SPINE SURGERY    . ELBOW SURGERY    . UMBILICAL HERNIA REPAIR         Family History  Problem Relation Age of Onset  . Lung disease Father   . Asthma Mother   . Healthy Sister   . Healthy Brother   . Healthy Child   . Neuropathy Neg Hx     Social History   Tobacco Use  . Smoking status: Former Smoker    Packs/day: 1.50    Types: Cigarettes    Quit date: 10/30/2016    Years since quitting: 3.1  . Smokeless tobacco: Never Used  Substance Use Topics    . Alcohol use: Not Currently    Alcohol/week: 0.0 standard drinks    Comment: "not since last year" 06/29/2017". one beer on 4th of july 2020.  Marland Kitchen Drug use: No    Home Medications Prior to Admission medications   Medication Sig Start Date End Date Taking? Authorizing Provider  ALPRAZolam (XANAX) 0.25 MG tablet Take 1-2 tabs (0.25mg -0.50mg ) 30-60 minutes before procedure. May repeat if needed.Do not drive. 06/17/19   Anson Fret, MD  Candesartan Cilexetil-HCTZ 32-25 MG TABS Take 1 tablet by mouth daily. 12/10/18   [provider]  celecoxib (CELEBREX) 200 MG capsule Take 200 mg by mouth 2 (two) times daily as needed.    [provider]  gabapentin (NEURONTIN) 300 MG capsule take1-2 capsules up to three times a day. 06/17/19   Anson Fret, MD  ketoconazole (NIZORAL) 2 % cream Apply 1 application topically 2 (two) times daily. Apply daily for 4 weeks and for 1 week after lesion has healed 06/11/19   Domenick Gong, MD  metoprolol succinate (TOPROL-XL) 100 MG 24 hr tablet Take 1 tablet (100 mg total) by mouth daily. Take with or immediately following a meal. 01/19/15   Gerhard Munch, MD  nortriptyline [facility]) 50 MG capsule Take 1 capsule by mouth at bedtime 12/02/19   Drema Dallas, DO  omeprazole (PRILOSEC) 40 MG capsule Take 1 capsule by mouth daily. 12/30/18   [provider]  simvastatin (ZOCOR) 10 MG tablet Take 10 mg by mouth daily.  03/18/15   [provider]  tamsulosin (FLOMAX) 0.4 MG CAPS capsule Take 0.4 mg by mouth daily after supper. 08/10/17   [provider]  tiZANidine (ZANAFLEX) 2 MG tablet Take 1 tablet (2 mg total) by mouth 3 (three) times daily. 12/02/19   Pieter Partridge, DO    Allergies    Patient has no known allergies.  Review of Systems   Review of Systems  Constitutional: Negative for chills and fever.  HENT: Negative for congestion.   Gastrointestinal: Negative for abdominal pain.  Musculoskeletal: Negative for  back pain.  Hematological: Does not bruise/bleed easily.  Psychiatric/Behavioral: Negative for confusion.  All other systems reviewed and are negative.   Physical Exam Updated Vital Signs BP 120/90 (BP Location: Right Arm)   Pulse 82   Temp 97.9 F (36.6 C) (Oral)   Resp 16   Ht 6\' 1"  (1.854 m)   Wt 82.6 kg   SpO2 100%   BMI 24.01 kg/m   Physical Exam Vitals and nursing note reviewed.  Constitutional:      General: He is not in acute distress.    Appearance: He is well-developed. He is not diaphoretic.  HENT:     Head: Normocephalic and atraumatic.  Eyes:     General: No scleral icterus.       Right eye: No discharge.        Left eye: No discharge.     Conjunctiva/sclera: Conjunctivae normal.  Cardiovascular:     Rate and Rhythm: Normal rate and regular rhythm.     Pulses: Normal pulses.  Pulmonary:     Effort: Pulmonary effort is normal. No respiratory distress.     Breath sounds: No stridor.  Musculoskeletal:        General: No deformity.  Skin:    General: Skin is warm and dry.     Comments: Please see clinical image.  There is a 1 cm area of tear on the right anterior shin.  No active bleeding at this time.  There is surrounding varicose veins.  Neurological:     Mental Status: He is alert.     Motor: No abnormal muscle tone.     Comments: Awake and alert, able to provide adequate history without difficulty.  Speech is not slurred.  Psychiatric:        Mood and Affect: Mood normal.        Behavior: Behavior normal.     ED Results / Procedures / Treatments   Labs (all labs ordered are listed, but only abnormal results are displayed) Labs Reviewed - No data to display  EKG None  Radiology No results found.  Procedures Procedures (including critical care time)  Medications Ordered in ED Medications - No data to display  ED Course  I have reviewed the triage vital signs and the nursing notes.  Pertinent labs & imaging results that were available  during my care of the patient were reviewed by me and considered in my medical decision making (see chart for details).    MDM Rules/Calculators/A&P                     Patient presents today from urgent care for evaluation of bleeding from his right anterior shin.  He has multiple varicose veins in the area and thinks  he may have scratched one of them putting his socks on.  He had a pressure dressing placed at urgent care.  At my evaluation he does not have any active bleeding, and there is only a pencil eraser sized area of dried blood on the pressure dressing.  Nonadherent dressing was placed.  Recommended PCP follow-up in 2 days for a wound check.  Discussed bleeding control precautions, and wound/dressing care.  He does not take any blood thinners.  Patient remained hemodynamically stable while in my care.  Return precautions were discussed with patient who states their understanding.  At the time of discharge patient denied any unaddressed complaints or concerns.  Patient is agreeable for discharge home.  Note: Portions of this report may have been transcribed using voice recognition software. 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

## 2019-12-08 NOTE — ED Notes (Signed)
Patient able to ambulate independently  

## 2019-12-16 DIAGNOSIS — R519 Headache, unspecified: Secondary | ICD-10-CM | POA: Diagnosis not present

## 2019-12-16 DIAGNOSIS — S81011A Laceration without foreign body, right knee, initial encounter: Secondary | ICD-10-CM | POA: Diagnosis not present

## 2019-12-19 DIAGNOSIS — Z20822 Contact with and (suspected) exposure to covid-19: Secondary | ICD-10-CM | POA: Diagnosis not present

## 2019-12-23 DIAGNOSIS — I10 Essential (primary) hypertension: Secondary | ICD-10-CM | POA: Diagnosis not present

## 2019-12-23 DIAGNOSIS — M159 Polyosteoarthritis, unspecified: Secondary | ICD-10-CM | POA: Diagnosis not present

## 2019-12-23 DIAGNOSIS — E785 Hyperlipidemia, unspecified: Secondary | ICD-10-CM | POA: Diagnosis not present

## 2019-12-23 DIAGNOSIS — K219 Gastro-esophageal reflux disease without esophagitis: Secondary | ICD-10-CM | POA: Diagnosis not present

## 2019-12-24 DIAGNOSIS — R739 Hyperglycemia, unspecified: Secondary | ICD-10-CM | POA: Diagnosis not present

## 2020-02-13 ENCOUNTER — Other Ambulatory Visit: Payer: Self-pay | Admitting: Neurology

## 2020-03-03 DIAGNOSIS — Z79899 Other long term (current) drug therapy: Secondary | ICD-10-CM | POA: Diagnosis not present

## 2020-03-03 DIAGNOSIS — D72829 Elevated white blood cell count, unspecified: Secondary | ICD-10-CM | POA: Diagnosis not present

## 2020-03-03 DIAGNOSIS — Z6836 Body mass index (BMI) 36.0-36.9, adult: Secondary | ICD-10-CM | POA: Diagnosis not present

## 2020-03-03 DIAGNOSIS — M25561 Pain in right knee: Secondary | ICD-10-CM | POA: Diagnosis not present

## 2020-03-27 ENCOUNTER — Other Ambulatory Visit: Payer: Self-pay

## 2020-03-27 ENCOUNTER — Ambulatory Visit: Payer: Self-pay | Admitting: Podiatry

## 2020-03-30 ENCOUNTER — Other Ambulatory Visit: Payer: Self-pay

## 2020-03-30 ENCOUNTER — Ambulatory Visit (INDEPENDENT_AMBULATORY_CARE_PROVIDER_SITE_OTHER): Payer: BC Managed Care – PPO | Admitting: Podiatry

## 2020-03-30 DIAGNOSIS — B351 Tinea unguium: Secondary | ICD-10-CM

## 2020-03-30 DIAGNOSIS — M79676 Pain in unspecified toe(s): Secondary | ICD-10-CM | POA: Diagnosis not present

## 2020-03-30 MED ORDER — TERBINAFINE HCL 250 MG PO TABS: 250.0000 mg | ORAL_TABLET | Freq: Every day | ORAL | 0 refills | Status: DC

## 2020-03-30 NOTE — Progress Notes (Signed)
Subjective:   Patient ID: Michael Whitaker, male   DOB: 64 y.o.   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

## 2020-04-03 ENCOUNTER — Other Ambulatory Visit: Payer: Self-pay

## 2020-04-03 ENCOUNTER — Encounter (HOSPITAL_COMMUNITY): Payer: Self-pay | Admitting: Emergency Medicine

## 2020-04-03 ENCOUNTER — Emergency Department (HOSPITAL_COMMUNITY)
Admission: EM | Admit: 2020-04-03 | Discharge: 2020-04-04 | Disposition: A | Discharge status: Home / Self Care | Payer: BC Managed Care – PPO | Attending: Emergency Medicine | Admitting: Emergency Medicine

## 2020-04-03 DIAGNOSIS — R2243 Localized swelling, mass and lump, lower limb, bilateral: Secondary | ICD-10-CM | POA: Diagnosis not present

## 2020-04-03 DIAGNOSIS — Z87891 Personal history of nicotine dependence: Secondary | ICD-10-CM | POA: Diagnosis not present

## 2020-04-03 DIAGNOSIS — R609 Edema, unspecified: Secondary | ICD-10-CM

## 2020-04-03 DIAGNOSIS — J449 Chronic obstructive pulmonary disease, unspecified: Secondary | ICD-10-CM | POA: Insufficient documentation

## 2020-04-03 DIAGNOSIS — I10 Essential (primary) hypertension: Secondary | ICD-10-CM | POA: Diagnosis not present

## 2020-04-03 DIAGNOSIS — E1165 Type 2 diabetes mellitus with hyperglycemia: Secondary | ICD-10-CM | POA: Diagnosis not present

## 2020-04-03 DIAGNOSIS — R739 Hyperglycemia, unspecified: Secondary | ICD-10-CM

## 2020-04-03 DIAGNOSIS — R7309 Other abnormal glucose: Secondary | ICD-10-CM | POA: Diagnosis not present

## 2020-04-03 DIAGNOSIS — Z79899 Other long term (current) drug therapy: Secondary | ICD-10-CM | POA: Diagnosis not present

## 2020-04-03 DIAGNOSIS — R600 Localized edema: Secondary | ICD-10-CM | POA: Diagnosis not present

## 2020-04-03 LAB — COMPREHENSIVE METABOLIC PANEL
ALT: 18 U/L (ref 0–44)
AST: 21 U/L (ref 15–41)
Albumin: 2.9 g/dL — ABNORMAL LOW (ref 3.5–5.0)
Alkaline Phosphatase: 67 U/L (ref 38–126)
Anion gap: 7 (ref 5–15)
BUN: 13 mg/dL (ref 8–23)
CO2: 26 mmol/L (ref 22–32)
Calcium: 8.2 mg/dL — ABNORMAL LOW (ref 8.9–10.3)
Chloride: 106 mmol/L (ref 98–111)
Creatinine, Ser: 0.99 mg/dL (ref 0.61–1.24)
GFR calc Af Amer: >60 mL/min (ref >60)
GFR calc non Af Amer: >60 mL/min (ref >60)
Glucose, Bld: 151 mg/dL — ABNORMAL HIGH (ref 70–99)
Potassium: 3.7 mmol/L (ref 3.5–5.1)
Sodium: 139 mmol/L (ref 135–145)
Total Bilirubin: 0.4 mg/dL (ref 0.3–1.2)
Total Protein: 6.6 g/dL (ref 6.5–8.1)

## 2020-04-03 LAB — CBC WITH DIFFERENTIAL/PLATELET
Abs Immature Granulocytes: 0.04 10*3/uL (ref 0.00–0.07)
Basophils Absolute: 0.1 10*3/uL (ref 0.0–0.1)
Basophils Relative: 0 %
Eosinophils Absolute: 0.2 10*3/uL (ref 0.0–0.5)
Eosinophils Relative: 2 %
HCT: 41.8 % (ref 39.0–52.0)
Hemoglobin: 13.2 g/dL (ref 13.0–17.0)
Immature Granulocytes: 0 %
Lymphocytes Relative: 31 %
Lymphs Abs: 3.5 10*3/uL (ref 0.7–4.0)
MCH: 29.7 pg (ref 26.0–34.0)
MCHC: 31.6 g/dL (ref 30.0–36.0)
MCV: 93.9 fL (ref 80.0–100.0)
Monocytes Absolute: 0.8 10*3/uL (ref 0.1–1.0)
Monocytes Relative: 7 %
Neutro Abs: 6.8 10*3/uL (ref 1.7–7.7)
Neutrophils Relative %: 60 %
Platelets: 279 10*3/uL (ref 150–400)
RBC: 4.45 MIL/uL (ref 4.22–5.81)
RDW: 13.8 % (ref 11.5–15.5)
WBC: 11.4 10*3/uL — ABNORMAL HIGH (ref 4.0–10.5)
nRBC: 0 % (ref 0.0–0.2)

## 2020-04-03 NOTE — ED Triage Notes (Signed)
Patient reports bilateral ankles/feet swelling for several days worse at right ankle , he is taking diuretic , denies SOB , no fever or chills .

## 2020-04-04 ENCOUNTER — Emergency Department (HOSPITAL_COMMUNITY): Payer: BC Managed Care – PPO

## 2020-04-04 DIAGNOSIS — R079 Chest pain, unspecified: Secondary | ICD-10-CM | POA: Diagnosis not present

## 2020-04-04 DIAGNOSIS — R600 Localized edema: Secondary | ICD-10-CM | POA: Diagnosis not present

## 2020-04-04 DIAGNOSIS — I10 Essential (primary) hypertension: Secondary | ICD-10-CM | POA: Diagnosis not present

## 2020-04-04 DIAGNOSIS — E1165 Type 2 diabetes mellitus with hyperglycemia: Secondary | ICD-10-CM | POA: Diagnosis not present

## 2020-04-04 LAB — BRAIN NATRIURETIC PEPTIDE: B Natriuretic Peptide: 29.8 pg/mL (ref 0.0–100.0)

## 2020-04-04 LAB — TROPONIN I (HIGH SENSITIVITY): Troponin I (High Sensitivity): 4 ng/L (ref <18)

## 2020-04-04 LAB — D-DIMER, QUANTITATIVE: D-Dimer, Quant: 0.41 ug/mL-FEU (ref 0.00–0.50)

## 2020-04-04 MED ORDER — FUROSEMIDE 10 MG/ML IJ SOLN
40.0000 mg | Freq: Once | INTRAMUSCULAR | Status: AC
  Administered 2020-04-04: 03:00:00 40 mg via INTRAVENOUS
  Filled 2020-04-04: qty 4

## 2020-04-04 MED ORDER — FUROSEMIDE 40 MG PO TABS
40.0000 mg | ORAL_TABLET | Freq: Every day | ORAL | 0 refills | Status: DC
Start: 2020-04-04 — End: 2021-02-23

## 2020-04-04 NOTE — ED Provider Notes (Signed)
[facility] EMERGENCY DEPARTMENT Provider Note   CSN: [id number] Arrival date & time: 04/03/20  2136   History Chief Complaint  Patient presents with  . Ankles /Feet Swelling    Michael Whitaker is a 64 y.o. male.  The history is provided by the patient.  He has a history of hypertension, hyperlipidemia and comes in because of leg swelling today.  He states his legs are always swollen but they were more swollen today.  He is also complaining of some vague chest discomfort which is somewhat worse if he takes a deep breath.  He denies fever or chills.  He denies nausea or vomiting or diaphoresis.  He is on a diuretic for his blood pressure, but states that it does not seem to be helping.  He is a cigarette smoker.  Past Medical History:  Diagnosis Date  . GERD (gastroesophageal reflux disease)   . Hyperlipemia   . Hypertension     Patient Active Problem List   Diagnosis Date Noted  . HTN (hypertension) 03/26/2015  . Tobacco abuse 03/26/2015  . Trigger finger, acquired 01/27/2015  . Fracture of fifth metacarpal bone of left hand 01/27/2015  . 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 Mild chronic obstructive pulmonary disease ([place]) 10/06/2011  . History of gastroesophageal reflux (GERD) 10/06/2011  . Osteoarthrosis, unspecified whether generalized or localized, upper arm 09/22/2011    Past Surgical History:  Procedure Laterality Date  . CERVICAL SPINE SURGERY    . ELBOW SURGERY    . UMBILICAL HERNIA REPAIR         Family History  Problem Relation Age of Onset  . Lung disease Father   . Asthma Mother   . Healthy Sister   . Healthy Brother   . Healthy Child   . Neuropathy Neg Hx     Social History   Tobacco Use  . Smoking status: Former Smoker    Packs/day: 1.50    Types: Cigarettes    Quit date: 10/30/2016    Years since quitting: 3.4  . Smokeless tobacco: Never Used  Substance Use Topics  . Alcohol use: Not Currently    Alcohol/week: 0.0  standard drinks    Comment: "not since last year" 06/29/2017". one beer on 4th of july 2020.  Marland Kitchen Drug use: No    Home Medications Prior to Admission medications   Medication Sig Start Date End Date Taking? Authorizing Provider  ALPRAZolam (XANAX) 0.25 MG tablet Take 1-2 tabs (0.25mg -0.50mg ) 30-60 minutes before procedure. May repeat if needed.Do not drive. 06/17/19   Melvenia Beam, MD  Candesartan Cilexetil-HCTZ 32-25 MG TABS Take 1 tablet by mouth daily. 12/10/18   [provider]  celecoxib (CELEBREX) 200 MG capsule Take 200 mg by mouth 2 (two) times daily as needed.    [provider]  gabapentin (NEURONTIN) 300 MG capsule take1-2 capsules up to three times a day. 06/17/19   Melvenia Beam, MD  ketoconazole (NIZORAL) 2 % cream Apply 1 application topically 2 (two) times daily. Apply daily for 4 weeks and for 1 week after lesion has healed 06/11/19   Melynda Ripple, MD  metoprolol succinate (TOPROL-XL) 100 MG 24 hr tablet Take 1 tablet (100 mg total) by mouth daily. Take with or immediately following a meal. 01/19/15   Carmin Muskrat, MD  nortriptyline (PAMELOR) 50 MG capsule Take 1 capsule by mouth at bedtime 02/14/20   Pieter Partridge, DO  omeprazole (PRILOSEC) 40 MG capsule Take 1 capsule by mouth  daily. 12/30/18   [provider]  simvastatin (ZOCOR) 10 MG tablet Take 10 mg by mouth daily.  03/18/15   [provider]  tamsulosin (FLOMAX) 0.4 MG CAPS capsule Take 0.4 mg by mouth daily after supper. 08/10/17   [provider]  terbinafine (LAMISIL) 250 MG tablet Take 1 tablet (250 mg total) by mouth daily. 03/30/20   Lenn Sink, DPM  tiZANidine (ZANAFLEX) 2 MG tablet Take 1 tablet (2 mg total) by mouth 3 (three) times daily. 12/02/19   Drema Dallas, DO    Allergies    Patient has no known allergies.  Review of Systems   Review of Systems  All other systems reviewed and are negative.   Physical Exam Updated Vital Signs BP (!) 134/95 (BP  Location: Right Arm)   Pulse 65   Temp 98.7 F (37.1 C) (Oral)   Resp 15   Ht 6\' 1"  (1.854 m)   Wt 127.5 kg   SpO2 100%   BMI 37.07 kg/m   Physical Exam Vitals and nursing note reviewed.   64 year old male, resting comfortably and in no acute distress. Vital signs are significant for mildly elevated blood pressure. Oxygen saturation is 100%, which is normal. Head is normocephalic and atraumatic. PERRLA, EOMI. Oropharynx is clear. Neck is nontender and supple without adenopathy or JVD. Back is nontender and there is no CVA tenderness.  There is trace presacral edema. Lungs are clear without rales, wheezes, or rhonchi. Chest is nontender. Heart has regular rate and rhythm without murmur. Abdomen is soft, flat, nontender without masses or hepatosplenomegaly and peristalsis is normoactive. Extremities have 2+ edema, full range of motion is present. Skin is warm and dry without rash. Neurologic: Mental status is normal, cranial nerves are intact, there are no motor or sensory deficits.  ED Results / Procedures / Treatments   Labs (all labs ordered are listed, but only abnormal results are displayed) Labs Reviewed  CBC WITH DIFFERENTIAL/PLATELET - Abnormal; Notable for the following components:      Result Value   WBC 11.4 (*)    All other components within normal limits  COMPREHENSIVE METABOLIC PANEL - Abnormal; Notable for the following components:   Glucose, Bld 151 (*)    Calcium 8.2 (*)    Albumin 2.9 (*)    All other components within normal limits  D-DIMER, QUANTITATIVE (NOT AT [facility])  BRAIN NATRIURETIC PEPTIDE  TROPONIN I (HIGH SENSITIVITY)  TROPONIN I (HIGH SENSITIVITY)    EKG EKG Interpretation  Date/Time:  Saturday Apr 04 2020 03:02:42 EDT Ventricular Rate:  68 PR Interval:    QRS Duration: 97 QT Interval:  413 QTC Calculation: 440 R Axis:   52 Text Interpretation: Sinus rhythm Normal ECG When compared with ECG of 05/23/2019, No significant change was found  Confirmed by 05/25/2019 (Dione Booze) on 04/04/2020 3:09:53 AM   Radiology DG Chest 2 View  Result Date: 04/04/2020 CLINICAL DATA:  Chest pain EXAM: CHEST - 2 VIEW COMPARISON:  May 23, 2019 FINDINGS: The heart size and mediastinal contours are within normal limits. Both lungs are clear. Cervical fixation hardware. IMPRESSION: No active cardiopulmonary disease. Electronically Signed   By: May 25, 2019 M.D.   On: 04/04/2020 03:43    Procedures Procedures  Medications Ordered in ED Medications  furosemide (LASIX) injection 40 mg (40 mg Intravenous Given 04/04/20 0253)    ED Course  I have reviewed the triage vital signs and the nursing notes.  Pertinent labs & imaging results  that were available during my care of the patient were reviewed by me and considered in my medical decision making (see chart for details).  MDM Rules/Calculators/A&P Peripheral edema which is probably multifactorial.  Labs do show hypoalbuminemia, probably also element of heart failure.  Other labs significant for mildly elevated glucose.  Troponin and BNP were not ordered in triage and are ordered now.  We will also check chest x-ray and EKG.  I am somewhat concerned about his leg swelling with some chest pain which is worse with inspiration and will check D-dimer to rule out pulmonary embolism.  Old records are reviewed, and he has no relevant past visits.  He did bring his medications with him and he is on low-dose hydrochlorothiazide.  Will give dose of furosemide.  Troponin and D-dimer are normal as is BNP.  ECG shows no acute changes.  He had good diuresis with intravenous furosemide.  He is discharged with prescription for furosemide.  Repeat troponin was not felt to be necessary since the one value obtained was 4 hours into his ED stay.  Final Clinical Impression(s) / ED Diagnoses Final diagnoses:  Peripheral edema  Elevated random blood glucose level    Rx / DC Orders ED Discharge Orders         Ordered     furosemide (LASIX) 40 MG tablet  Daily     04/04/20 0410           Dione Booze, MD 04/04/20 [phone]

## 2020-04-04 NOTE — ED Notes (Signed)
Transported to xray 

## 2020-04-04 NOTE — Discharge Instructions (Addendum)
Your blood sugar was slightly elevated today - 151.  This needs to be followed closely since it may indicate that you are developing diabetes.  Your primary care provider will need to check your glucose periodically and, if it starts going higher, may need to start you on medication to control it.

## 2020-04-07 DIAGNOSIS — Z6836 Body mass index (BMI) 36.0-36.9, adult: Secondary | ICD-10-CM | POA: Diagnosis not present

## 2020-04-07 DIAGNOSIS — R609 Edema, unspecified: Secondary | ICD-10-CM | POA: Diagnosis not present

## 2020-04-07 DIAGNOSIS — Z79899 Other long term (current) drug therapy: Secondary | ICD-10-CM | POA: Diagnosis not present

## 2020-05-06 DIAGNOSIS — M25561 Pain in right knee: Secondary | ICD-10-CM | POA: Diagnosis not present

## 2020-05-06 DIAGNOSIS — Z6835 Body mass index (BMI) 35.0-35.9, adult: Secondary | ICD-10-CM | POA: Diagnosis not present

## 2020-05-06 DIAGNOSIS — Z79899 Other long term (current) drug therapy: Secondary | ICD-10-CM | POA: Diagnosis not present

## 2020-05-08 DIAGNOSIS — M1711 Unilateral primary osteoarthritis, right knee: Secondary | ICD-10-CM | POA: Diagnosis not present

## 2020-05-08 DIAGNOSIS — M25561 Pain in right knee: Secondary | ICD-10-CM | POA: Diagnosis not present

## 2020-05-21 DIAGNOSIS — M1711 Unilateral primary osteoarthritis, right knee: Secondary | ICD-10-CM | POA: Diagnosis not present

## 2020-05-23 ENCOUNTER — Encounter: Payer: Self-pay | Admitting: Emergency Medicine

## 2020-05-23 ENCOUNTER — Other Ambulatory Visit: Payer: Self-pay

## 2020-05-23 ENCOUNTER — Ambulatory Visit
Admission: EM | Admit: 2020-05-23 | Discharge: 2020-05-23 | Disposition: A | Discharge status: Home / Self Care | Payer: BC Managed Care – PPO | Attending: Emergency Medicine | Admitting: Emergency Medicine

## 2020-05-23 DIAGNOSIS — M7918 Myalgia, other site: Secondary | ICD-10-CM

## 2020-05-23 DIAGNOSIS — R1310 Dysphagia, unspecified: Secondary | ICD-10-CM

## 2020-05-23 MED ORDER — METHYLPREDNISOLONE SODIUM SUCC 125 MG IJ SOLR
125.0000 mg | Freq: Once | INTRAMUSCULAR | Status: AC
  Administered 2020-05-23: 125 mg via INTRAMUSCULAR

## 2020-05-23 NOTE — Discharge Instructions (Addendum)
Important to continue your Prilosec once daily. You are given steroid shot today to help with your right shoulder. May apply ice as well and take Tylenol. Important follow-up with your primary care for further evaluation if needed. Very important to see specialist for issues swallowing as there can be more serious causes for this. Go to ER if you develop any difficulty breathing, choke on food, or feeling food is stuck in your esophagus.

## 2020-05-23 NOTE — ED Provider Notes (Signed)
[name]    CSN: [id number] Arrival date & time: 05/23/20  1252      History   Chief Complaint Chief Complaint  Patient presents with  . Shoulder Pain    right   . Sore Throat    HPI Michael Whitaker is a 64 y.o. male with history of hypertension, hyperlipidemia, GERD, tobacco use presenting for 3-4-day course of dysphagia and right shoulder pain.  Patient states pain is located over right shoulder: Denies fall, injury.  States he might of slept on it wrong.  Denies distal extremity paresthesias or weakness.  No neck pain (patient is s/p neck surgery for left shoulder pain) or fever.  Patient states he has had some difficulty swallowing during this time as well.  Denies change in medication.  Takes Prilosec once daily for reflux.  States he still feels like "stuff comes up after swallow ".  Denies vomiting or nausea, abdominal pain.  Patient does have history of smoking: Has quit x3-4 years, though had 40-year pack history prior.  No known malignancy, is followed routinely by his PCP.  States his weight stable.   Past Medical History:  Diagnosis Date  . GERD (gastroesophageal reflux disease)   . Hyperlipemia   . Hypertension     Patient Active Problem List   Diagnosis Date Noted  . HTN (hypertension) 03/26/2015  . Tobacco abuse 03/26/2015  . Trigger finger, acquired 01/27/2015  . Fracture of fifth metacarpal bone of left hand 01/27/2015  . 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 Mild chronic obstructive pulmonary disease (HCC) 10/06/2011  . History of gastroesophageal reflux (GERD) 10/06/2011  . Osteoarthrosis, unspecified whether generalized or localized, upper arm 09/22/2011    Past Surgical History:  Procedure Laterality Date  . CERVICAL SPINE SURGERY    . ELBOW SURGERY    . UMBILICAL HERNIA REPAIR         Home Medications    Prior to Admission medications   Medication Sig Start Date End Date Taking? Authorizing Provider  Candesartan Cilexetil-HCTZ  32-25 MG TABS Take 1 tablet by mouth daily. 12/10/18   [provider]  celecoxib (CELEBREX) 200 MG capsule Take 200 mg by mouth 2 (two) times daily as needed.    [provider]  furosemide (LASIX) 40 MG tablet Take 1 tablet (40 mg total) by mouth daily. 04/04/20   Dione Booze, MD  gabapentin (NEURONTIN) 300 MG capsule take1-2 capsules up to three times a day. Patient taking differently: Take 300-600 mg by mouth 3 (three) times daily.  06/17/19   Anson Fret, MD  metoprolol succinate (TOPROL-XL) 100 MG 24 hr tablet Take 1 tablet (100 mg total) by mouth daily. Take with or immediately following a meal. 01/19/15   Gerhard Munch, MD  nortriptyline (PAMELOR) 50 MG capsule Take 1 capsule by mouth at bedtime 02/14/20   Drema Dallas, DO  omeprazole (PRILOSEC) 40 MG capsule Take 1 capsule by mouth daily. 12/30/18   [provider]  simvastatin (ZOCOR) 10 MG tablet Take 10 mg by mouth daily.  03/18/15   [provider]  tamsulosin (FLOMAX) 0.4 MG CAPS capsule Take 0.4 mg by mouth daily after supper. 08/10/17   [provider]  terbinafine (LAMISIL) 250 MG tablet Take 1 tablet (250 mg total) by mouth daily. 03/30/20   Lenn Sink, DPM  tiZANidine (ZANAFLEX) 2 MG tablet Take 1 tablet (2 mg total) by mouth 3 (three) times daily. 12/02/19   Drema Dallas, DO  Family History Family History  Problem Relation Age of Onset  . Lung disease Father   . Asthma Mother   . Healthy Sister   . Healthy Brother   . Healthy Child   . Neuropathy Neg Hx     Social History Social History   Tobacco Use  . Smoking status: Former Smoker    Packs/day: 1.50    Types: Cigarettes    Quit date: 10/30/2016    Years since quitting: 3.5  . Smokeless tobacco: Never Used  Vaping Use  . Vaping Use: Never used  Substance Use Topics  . Alcohol use: Not Currently    Alcohol/week: 0.0 standard drinks    Comment: "not since last year" 06/29/2017". one beer on 4th of july 2020.   Marland Kitchen Drug use: No     Allergies   Patient has no known allergies.   Review of Systems As per HPI   Physical Exam Triage Vital Signs ED Triage Vitals [05/23/20 1304]  Enc Vitals Group     BP 125/77     Pulse Rate 80     Resp 18     Temp 98.2 F (36.8 C)     Temp Source Oral     SpO2 96 %     Weight      Height      Head Circumference      Peak Flow      Pain Score      Pain Loc      Pain Edu?      Excl. in GC?    No data found.  Updated Vital Signs BP 125/77 (BP Location: Left Arm)   Pulse 80   Temp 98.2 F (36.8 C) (Oral)   Resp 18   Wt 277 lb (125.6 kg)   SpO2 96%   BMI 36.55 kg/m   Visual Acuity Right Eye Distance:   Left Eye Distance:   Bilateral Distance:    Right Eye Near:   Left Eye Near:    Bilateral Near:     Physical Exam Constitutional:      General: He is not in acute distress.    Appearance: He is obese. He is not ill-appearing.  HENT:     Head: Normocephalic and atraumatic.     Mouth/Throat:     Mouth: Mucous membranes are moist.     Pharynx: Oropharynx is clear.     Comments: Uvula midline, nonedematous Eyes:     General: No scleral icterus.    Pupils: Pupils are equal, round, and reactive to light.  Neck:     Comments: Longitudinal surgical scar noted.  Well-healed, nontender Cardiovascular:     Rate and Rhythm: Normal rate and regular rhythm.  Pulmonary:     Effort: Pulmonary effort is normal. No respiratory distress.     Breath sounds: No wheezing or rales.  Musculoskeletal:        General: Tenderness present. No swelling. Normal range of motion.     Cervical back: Normal range of motion and neck supple. No rigidity or tenderness.     Right lower leg: No edema.     Left lower leg: No edema.     Comments: Strength 5/5 in upper extremities bilaterally.  NVI.  Patient does have deltoid tenderness without crepitus.  No clavicular, scapular, AC joint tenderness.  Lymphadenopathy:     Cervical: No cervical adenopathy.  Skin:     Coloration: Skin is not jaundiced or pale.  Neurological:  Mental Status: He is alert and oriented to person, place, and time.      UC Treatments / Results  Labs (all labs ordered are listed, but only abnormal results are displayed) Labs Reviewed - No data to display  EKG   Radiology No results found.  Procedures Procedures (including critical care time)  Medications Ordered in UC Medications  methylPREDNISolone sodium succinate (SOLU-MEDROL) 125 mg/2 mL injection 125 mg (125 mg Intramuscular Given 05/23/20 1334)    Initial Impression / Assessment and Plan / UC Course  I have reviewed the triage vital signs and the nursing notes.  Pertinent labs & imaging results that were available during my care of the patient were reviewed by me and considered in my medical decision making (see chart for details).     Nontoxic in office today.  Overall appears well.  H&P consistent with right deltoid pain, likely second to sleeping position versus strain.  No alarm symptoms as mentioned in HPI or exam.  Given Solu-Medrol in office which he tolerated well.  Regarding dysphagia: No obvious neck mass/lymphadenopathy or systemic symptoms.  Patient does have significant smoking and GERD history.  Will continue PPI, follow-up with GI/PCP for further evaluation.  Discussed importance of ENT follow-up if voice becomes worse.  Return precautions discussed, patient verbalized understanding and is agreeable to plan. Final Clinical Impressions(s) / UC Diagnoses   Final diagnoses:  Pain of right deltoid  Dysphagia, unspecified type     Discharge Instructions     Important to continue your Prilosec once daily. You are given steroid shot today to help with your right shoulder. May apply ice as well and take Tylenol. Important follow-up with your primary care for further evaluation if needed. Very important to see specialist for issues swallowing as there can be more serious causes for this.  Go to ER if you develop any difficulty breathing, choke on food, or feeling food is stuck in your esophagus.    ED Prescriptions    None     PDMP not reviewed this encounter.   Hall-Potvin, [place], [place] 05/23/20 1335

## 2020-05-23 NOTE — ED Triage Notes (Signed)
Shoulder pain (right) that started few days ago.  The pain comes and goes.  Pt also feels like he is having a hard time swallowing.

## 2020-06-02 ENCOUNTER — Other Ambulatory Visit: Payer: Self-pay

## 2020-06-02 ENCOUNTER — Ambulatory Visit
Admission: EM | Admit: 2020-06-02 | Discharge: 2020-06-02 | Disposition: A | Discharge status: Home / Self Care | Payer: BC Managed Care – PPO | Attending: Emergency Medicine | Admitting: Emergency Medicine

## 2020-06-02 DIAGNOSIS — L309 Dermatitis, unspecified: Secondary | ICD-10-CM | POA: Diagnosis not present

## 2020-06-02 MED ORDER — TRIAMCINOLONE ACETONIDE 0.5 % EX OINT
1.0000 | TOPICAL_OINTMENT | Freq: Two times a day (BID) | CUTANEOUS | 0 refills | Status: DC
Start: 2020-06-02 — End: 2021-02-07

## 2020-06-02 NOTE — ED Provider Notes (Signed)
[name]    CSN: [id number] Arrival date & time: 06/02/20  1406      History   Chief Complaint Chief Complaint  Patient presents with  . Insect Bite    HPI Michael Whitaker is a 64 y.o. male with history of hypertension presenting for left arm rash, itching.  States that he thinks a bug bit him so has been itching it.  Concerned about infection as it is red.  Denies streaking, warmth, pain, fever.   Past Medical History:  Diagnosis Date  . GERD (gastroesophageal reflux disease)   . Hyperlipemia   . Hypertension     Patient Active Problem List   Diagnosis Date Noted  . HTN (hypertension) 03/26/2015  . Tobacco abuse 03/26/2015  . Trigger finger, acquired 01/27/2015  . Fracture of fifth metacarpal bone of left hand 01/27/2015  . 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 Mild chronic obstructive pulmonary disease (HCC) 10/06/2011  . History of gastroesophageal reflux (GERD) 10/06/2011  . Osteoarthrosis, unspecified whether generalized or localized, upper arm 09/22/2011    Past Surgical History:  Procedure Laterality Date  . CERVICAL SPINE SURGERY    . ELBOW SURGERY    . UMBILICAL HERNIA REPAIR         Home Medications    Prior to Admission medications   Medication Sig Start Date End Date Taking? Authorizing Provider  Candesartan Cilexetil-HCTZ 32-25 MG TABS Take 1 tablet by mouth daily. 12/10/18   [provider]  celecoxib (CELEBREX) 200 MG capsule Take 200 mg by mouth 2 (two) times daily as needed.    [provider]  furosemide (LASIX) 40 MG tablet Take 1 tablet (40 mg total) by mouth daily. 04/04/20   Dione Booze, MD  gabapentin (NEURONTIN) 300 MG capsule take1-2 capsules up to three times a day. Patient taking differently: Take 300-600 mg by mouth 3 (three) times daily.  06/17/19   Anson Fret, MD  metoprolol succinate (TOPROL-XL) 100 MG 24 hr tablet Take 1 tablet (100 mg total) by mouth daily. Take with or immediately following a  meal. 01/19/15   Gerhard Munch, MD  nortriptyline (PAMELOR) 50 MG capsule Take 1 capsule by mouth at bedtime 02/14/20   Drema Dallas, DO  omeprazole (PRILOSEC) 40 MG capsule Take 1 capsule by mouth daily. 12/30/18   [provider]  simvastatin (ZOCOR) 10 MG tablet Take 10 mg by mouth daily.  03/18/15   [provider]  tamsulosin (FLOMAX) 0.4 MG CAPS capsule Take 0.4 mg by mouth daily after supper. 08/10/17   [provider]  terbinafine (LAMISIL) 250 MG tablet Take 1 tablet (250 mg total) by mouth daily. 03/30/20   Lenn Sink, DPM  tiZANidine (ZANAFLEX) 2 MG tablet Take 1 tablet (2 mg total) by mouth 3 (three) times daily. 12/02/19   Drema Dallas, DO  triamcinolone ointment (KENALOG) 0.5 % Apply 1 application topically 2 (two) times daily. 06/02/20   Hall-Potvin, [place], PA-C    Family History Family History  Problem Relation Age of Onset  . Lung disease Father   . Asthma Mother   . Healthy Sister   . Healthy Brother   . Healthy Child   . Neuropathy Neg Hx     Social History Social History   Tobacco Use  . Smoking status: Former Smoker    Packs/day: 1.50    Types: Cigarettes    Quit date: 10/30/2016    Years since quitting: 3.5  . Smokeless tobacco: Never  Used  Vaping Use  . Vaping Use: Never used  Substance Use Topics  . Alcohol use: Not Currently    Alcohol/week: 0.0 standard drinks    Comment: "not since last year" 06/29/2017". one beer on 4th of july 2020.  Marland Kitchen Drug use: No     Allergies   Patient has no known allergies.   Review of Systems As per HPI   Physical Exam Triage Vital Signs ED Triage Vitals  Enc Vitals Group     BP 06/02/20 1406 121/77     Pulse Rate 06/02/20 1406 82     Resp 06/02/20 1406 18     Temp 06/02/20 1406 (!) 96.4 F (35.8 C)     Temp Source 06/02/20 1406 Tympanic     SpO2 06/02/20 1406 94 %     Weight --      Height --      Head Circumference --      Peak Flow --      Pain Score 06/02/20 1410 0      Pain Loc --      Pain Edu? --      Excl. in GC? --    No data found.  Updated Vital Signs BP 121/77 (BP Location: Left Arm)   Pulse 82   Temp (!) 96.4 F (35.8 C) (Tympanic)   Resp 18   SpO2 94%   Visual Acuity Right Eye Distance:   Left Eye Distance:   Bilateral Distance:    Right Eye Near:   Left Eye Near:    Bilateral Near:     Physical Exam Constitutional:      General: He is not in acute distress. HENT:     Head: Normocephalic and atraumatic.  Eyes:     General: No scleral icterus.    Pupils: Pupils are equal, round, and reactive to light.  Cardiovascular:     Rate and Rhythm: Normal rate.  Pulmonary:     Effort: Pulmonary effort is normal. No respiratory distress.     Breath sounds: No wheezing.  Skin:    Coloration: Skin is not jaundiced or pale.     Comments: Slight excoriation without surrounding erythema, warmth, tenderness.  No discharge.  Neurological:     Mental Status: He is alert and oriented to person, place, and time.      UC Treatments / Results  Labs (all labs ordered are listed, but only abnormal results are displayed) Labs Reviewed - No data to display  EKG   Radiology No results found.  Procedures Procedures (including critical care time)  Medications Ordered in UC Medications - No data to display  Initial Impression / Assessment and Plan / UC Course  I have reviewed the triage vital signs and the nursing notes.  Pertinent labs & imaging results that were available during my care of the patient were reviewed by me and considered in my medical decision making (see chart for details).     Afebrile, nontoxic.  Patient with slight dermatitis: We will start triamcinolone, keep nails short and clean to reduce risk of secondary infection. Final Clinical Impressions(s) / UC Diagnoses   Final diagnoses:  Dermatitis     Discharge Instructions     Keep skin clean - may use gentle soaps without perfumes/dyes. Avoid hot water  (showers, baths) as this can further dry out and irritate skin. Pat skin dry as rubbing can irritate and tear skin. Apply a gentle moisturizer 1-2 times daily.    ED Prescriptions  Medication Sig Dispense Auth. Provider   triamcinolone ointment (KENALOG) 0.5 % Apply 1 application topically 2 (two) times daily. 30 g Hall-Potvin, [place], PA-C     PDMP not reviewed this encounter.   Hall-Potvin, [place], [place] 06/02/20 1429

## 2020-06-02 NOTE — Discharge Instructions (Signed)
Keep skin clean - may use gentle soaps without perfumes/dyes. Avoid hot water (showers, baths) as this can further dry out and irritate skin. Pat skin dry as rubbing can irritate and tear skin. Apply a gentle moisturizer 1-2 times daily. 

## 2020-06-02 NOTE — ED Triage Notes (Signed)
Pt woke up with bug bite on left forearm, concerned it is infected due to itching

## 2020-06-05 ENCOUNTER — Other Ambulatory Visit: Payer: Self-pay | Admitting: Neurology

## 2020-06-26 ENCOUNTER — Other Ambulatory Visit: Payer: Self-pay

## 2020-06-26 ENCOUNTER — Ambulatory Visit
Admission: EM | Admit: 2020-06-26 | Discharge: 2020-06-26 | Disposition: A | Discharge status: Home / Self Care | Payer: BC Managed Care – PPO | Attending: Physician Assistant | Admitting: Physician Assistant

## 2020-06-26 ENCOUNTER — Ambulatory Visit (INDEPENDENT_AMBULATORY_CARE_PROVIDER_SITE_OTHER): Payer: BC Managed Care – PPO

## 2020-06-26 DIAGNOSIS — M7989 Other specified soft tissue disorders: Secondary | ICD-10-CM | POA: Diagnosis not present

## 2020-06-26 DIAGNOSIS — M25461 Effusion, right knee: Secondary | ICD-10-CM | POA: Diagnosis not present

## 2020-06-26 DIAGNOSIS — M25561 Pain in right knee: Secondary | ICD-10-CM

## 2020-06-26 DIAGNOSIS — M1711 Unilateral primary osteoarthritis, right knee: Secondary | ICD-10-CM | POA: Diagnosis not present

## 2020-06-26 DIAGNOSIS — M79651 Pain in right thigh: Secondary | ICD-10-CM | POA: Diagnosis not present

## 2020-06-26 DIAGNOSIS — S8991XA Unspecified injury of right lower leg, initial encounter: Secondary | ICD-10-CM | POA: Diagnosis not present

## 2020-06-26 MED ORDER — KETOROLAC TROMETHAMINE 15 MG/ML IJ SOLN
15.0000 mg | Freq: Once | INTRAMUSCULAR | Status: AC
  Administered 2020-06-26: 15 mg via INTRAMUSCULAR

## 2020-06-26 NOTE — ED Provider Notes (Signed)
[name]    CSN: [id number] Arrival date & time: 06/26/20  1224      History   Chief Complaint Chief Complaint  Patient presents with  . Leg Pain    HPI Michael Whitaker is a 64 y.o. male.   64 year old male comes in for right upper leg pain x 3 days. Was chased by a dog, and felt a pop to right upper leg/knee prior to pain onset. Right knee pain radiating up anterior thigh. No numbness/tingling. Having trouble with ROM of hip and knee due to the pain. Tizanidine with some relief. Has not taken his celecoxib in the past 2 days.     Past Medical History:  Diagnosis Date  . GERD (gastroesophageal reflux disease)   . Hyperlipemia   . Hypertension     Patient Active Problem List   Diagnosis Date Noted  . HTN (hypertension) 03/26/2015  . Tobacco abuse 03/26/2015  . Trigger finger, acquired 01/27/2015  . Fracture of fifth metacarpal bone of left hand 01/27/2015  . 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 Mild chronic obstructive pulmonary disease (HCC) 10/06/2011  . History of gastroesophageal reflux (GERD) 10/06/2011  . Osteoarthrosis, unspecified whether generalized or localized, upper arm 09/22/2011    Past Surgical History:  Procedure Laterality Date  . CERVICAL SPINE SURGERY    . ELBOW SURGERY    . UMBILICAL HERNIA REPAIR         Home Medications    Prior to Admission medications   Medication Sig Start Date End Date Taking? Authorizing Provider  Candesartan Cilexetil-HCTZ 32-25 MG TABS Take 1 tablet by mouth daily. 12/10/18   [provider]  celecoxib (CELEBREX) 200 MG capsule Take 200 mg by mouth 2 (two) times daily as needed.    [provider]  furosemide (LASIX) 40 MG tablet Take 1 tablet (40 mg total) by mouth daily. 04/04/20   Dione Booze, MD  gabapentin (NEURONTIN) 300 MG capsule take1-2 capsules up to three times a day. Patient taking differently: Take 300-600 mg by mouth 3 (three) times daily.  06/17/19   Anson Fret,  MD  metoprolol succinate (TOPROL-XL) 100 MG 24 hr tablet Take 1 tablet (100 mg total) by mouth daily. Take with or immediately following a meal. 01/19/15   Gerhard Munch, MD  nortriptyline (PAMELOR) 50 MG capsule Take 1 capsule by mouth at bedtime 06/09/20   Drema Dallas, DO  omeprazole (PRILOSEC) 40 MG capsule Take 1 capsule by mouth daily. 12/30/18   [provider]  simvastatin (ZOCOR) 10 MG tablet Take 10 mg by mouth daily.  03/18/15   [provider]  tamsulosin (FLOMAX) 0.4 MG CAPS capsule Take 0.4 mg by mouth daily after supper. 08/10/17   [provider]  terbinafine (LAMISIL) 250 MG tablet Take 1 tablet (250 mg total) by mouth daily. 03/30/20   Lenn Sink, DPM  tiZANidine (ZANAFLEX) 2 MG tablet Take 1 tablet (2 mg total) by mouth 3 (three) times daily. 12/02/19   Drema Dallas, DO  triamcinolone ointment (KENALOG) 0.5 % Apply 1 application topically 2 (two) times daily. 06/02/20   Hall-Potvin, [place], PA-C    Family History Family History  Problem Relation Age of Onset  . Lung disease Father   . Asthma Mother   . Healthy Sister   . Healthy Brother   . Healthy Child   . Neuropathy Neg Hx     Social History Social History   Tobacco Use  .  Smoking status: Former Smoker    Packs/day: 1.50    Types: Cigarettes    Quit date: 10/30/2016    Years since quitting: 3.6  . Smokeless tobacco: Never Used  Vaping Use  . Vaping Use: Never used  Substance Use Topics  . Alcohol use: Not Currently    Alcohol/week: 0.0 standard drinks    Comment: "not since last year" 06/29/2017". one beer on 4th of july 2020.  Marland Kitchen Drug use: No     Allergies   Patient has no known allergies.   Review of Systems Review of Systems  Reason unable to perform ROS: See HPI as above.     Physical Exam Triage Vital Signs ED Triage Vitals  Enc Vitals Group     BP 06/26/20 1234 118/77     Pulse --      Resp 06/26/20 1234 18     Temp 06/26/20 1234 97.8 F (36.6 C)      Temp Source 06/26/20 1234 Oral     SpO2 06/26/20 1234 95 %     Weight --      Height --      Head Circumference --      Peak Flow --      Pain Score 06/26/20 1236 10     Pain Loc --      Pain Edu? --      Excl. in GC? --    No data found.  Updated Vital Signs BP 118/77 (BP Location: Left Arm)   Temp 97.8 F (36.6 C) (Oral)   Resp 18   SpO2 95%   Physical Exam Constitutional:      General: He is not in acute distress.    Appearance: Normal appearance. He is well-developed. He is not toxic-appearing or diaphoretic.  HENT:     Head: Normocephalic and atraumatic.  Eyes:     Conjunctiva/sclera: Conjunctivae normal.     Pupils: Pupils are equal, round, and reactive to light.  Pulmonary:     Effort: Pulmonary effort is normal. No respiratory distress.  Musculoskeletal:     Cervical back: Normal range of motion and neck supple.     Comments: Right knee swelling without erythema, contusion. Tenderness to palpation diffusely along right anterior thigh/rectus femoris. Tenderness to palpation of superior knee. No joint line tenderness. Unable to attempt active ROM due to pain. Full passive ROM of hip, knee. Sensation intact.   Skin:    General: Skin is warm and dry.  Neurological:     Mental Status: He is alert and oriented to person, place, and time.      UC Treatments / Results  Labs (all labs ordered are listed, but only abnormal results are displayed) Labs Reviewed - No data to display  EKG   Radiology DG Knee Complete 4 Views Right  Result Date: 06/26/2020 CLINICAL DATA:  Injury.  Swelling. EXAM: RIGHT KNEE - COMPLETE 4+ VIEW COMPARISON:  05/08/2020. FINDINGS: Soft tissue swelling. Moderate knee joint effusion. Diffuse osteopenia and tricompartment degenerative change. No acute bony or joint abnormality identified no evidence of fracture or dislocation. IMPRESSION: 1.  Soft tissue swelling.  Moderate knee joint effusion. 2. Diffuse osteopenia and tricompartment  degenerative change. No acute bony abnormality identified. Electronically Signed   By: Maisie Fus  Register   On: 06/26/2020 13:37    Procedures Procedures (including critical care time)  Medications Ordered in UC Medications  ketorolac (TORADOL) 15 MG/ML injection 15 mg (15 mg Intramuscular Given 06/26/20 1402)    Initial  Impression / Assessment and Plan / UC Course  I have reviewed the triage vital signs and the nursing notes.  Pertinent labs & imaging results that were available during my care of the patient were reviewed by me and considered in my medical decision making (see chart for details).    X-ray negative for fracture or dislocation. ?  Injury to the rectus femoris given patient current history and exam.  Toradol injection in office today.  Other symptomatic treatment discussed.  Return precautions given.  Patient expresses understanding and agrees plan.  Final Clinical Impressions(s) / UC Diagnoses   Final diagnoses:  Right thigh pain    ED Prescriptions    None     I have reviewed the PDMP during this encounter.   Belinda Fisher, PA-C 06/26/20 1453

## 2020-06-26 NOTE — ED Triage Notes (Signed)
Pt states was chased by a dog 3 days ago and when he was running he felt a pop to rt upper leg. States pain from rt upper leg down to knee and can't pick rt leg up.

## 2020-06-26 NOTE — Discharge Instructions (Addendum)
Toradol injection in office today.  Take Celebrex 200 mg twice a day daily for the next 5 to 7 days.  Tizanidine as needed.  He can use Voltaren gel (diclofenac gel) 4g 4 times a day. Ice compress. Can try doing exercises attached. Follow up with PCP/orthopedics if symptoms not improving.

## 2020-06-30 DIAGNOSIS — M1711 Unilateral primary osteoarthritis, right knee: Secondary | ICD-10-CM | POA: Diagnosis not present

## 2020-07-14 ENCOUNTER — Telehealth: Payer: Self-pay | Admitting: Neurology

## 2020-07-14 NOTE — Telephone Encounter (Signed)
Patient came in office asking to see Dr Everlena Cooper sooner. Added his appointment to the waitlist. States he is having trouble raising his legs. Has fallen twice. Patient asks to please leave a voicemail if he isn't able to answer.

## 2020-07-14 NOTE — Telephone Encounter (Signed)
While these sx's are most certainly concerning, I don't see that Dr. Everlena Cooper has ever seen the patient for these symptoms.  Looks like Dr. Everlena Cooper sees the patient for headache.  Have patient f/u with PCP first as Dr. Everlena Cooper is going to need to make sure that this is a neurologic problem and not a primary medical issue.

## 2020-07-14 NOTE — Telephone Encounter (Signed)
Pt advised to see his pcp for the symptoms first. Pt verbalized understanding.

## 2020-07-21 DIAGNOSIS — I10 Essential (primary) hypertension: Secondary | ICD-10-CM | POA: Diagnosis not present

## 2020-07-21 DIAGNOSIS — M159 Polyosteoarthritis, unspecified: Secondary | ICD-10-CM | POA: Diagnosis not present

## 2020-07-21 DIAGNOSIS — E785 Hyperlipidemia, unspecified: Secondary | ICD-10-CM | POA: Diagnosis not present

## 2020-07-21 DIAGNOSIS — Z1331 Encounter for screening for depression: Secondary | ICD-10-CM | POA: Diagnosis not present

## 2020-07-21 DIAGNOSIS — K219 Gastro-esophageal reflux disease without esophagitis: Secondary | ICD-10-CM | POA: Diagnosis not present

## 2020-08-02 ENCOUNTER — Encounter: Payer: Self-pay | Admitting: Emergency Medicine

## 2020-08-02 ENCOUNTER — Ambulatory Visit
Admission: EM | Admit: 2020-08-02 | Discharge: 2020-08-02 | Disposition: A | Discharge status: Home / Self Care | Payer: BC Managed Care – PPO | Attending: Emergency Medicine | Admitting: Emergency Medicine

## 2020-08-02 ENCOUNTER — Other Ambulatory Visit: Payer: Self-pay

## 2020-08-02 DIAGNOSIS — S61315A Laceration without foreign body of left ring finger with damage to nail, initial encounter: Secondary | ICD-10-CM

## 2020-08-02 NOTE — ED Triage Notes (Signed)
Pt here for laceration to left ring finger from fan yesterday; pt sts was still bleeding last night but bleeding controlled at present

## 2020-08-02 NOTE — Discharge Instructions (Addendum)
Keep wound clean, dry. Important wash daily, pat dry and air out. Return for worsening pain, swelling, bleeding.

## 2020-08-02 NOTE — ED Provider Notes (Signed)
[name]    CSN: [id number] Arrival date & time: 08/02/20  1119      History   Chief Complaint Chief Complaint  Patient presents with  . Finger Injury    HPI Michael Whitaker is a 64 y.o. male   Presented for laceration to left ring finger.  States this occurred yesterday, hit by a fan.  Tetanus up-to-date.  States bleeding was not controlled last night, though with direct pressure has stopped today.  Denies anticoagulant/blood thinner use.  No numbness, deformity.  Past Medical History:  Diagnosis Date  . GERD (gastroesophageal reflux disease)   . Hyperlipemia   . Hypertension     Patient Active Problem List   Diagnosis Date Noted  . HTN (hypertension) 03/26/2015  . Tobacco abuse 03/26/2015  . Trigger finger, acquired 01/27/2015  . Fracture of fifth metacarpal bone of left hand 01/27/2015  . 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 Mild chronic obstructive pulmonary disease (HCC) 10/06/2011  . History of gastroesophageal reflux (GERD) 10/06/2011  . Osteoarthrosis, unspecified whether generalized or localized, upper arm 09/22/2011    Past Surgical History:  Procedure Laterality Date  . CERVICAL SPINE SURGERY    . ELBOW SURGERY    . UMBILICAL HERNIA REPAIR         Home Medications    Prior to Admission medications   Medication Sig Start Date End Date Taking? Authorizing Provider  Candesartan Cilexetil-HCTZ 32-25 MG TABS Take 1 tablet by mouth daily. 12/10/18   [provider]  celecoxib (CELEBREX) 200 MG capsule Take 200 mg by mouth 2 (two) times daily as needed.    [provider]  furosemide (LASIX) 40 MG tablet Take 1 tablet (40 mg total) by mouth daily. 04/04/20   Dione Booze, MD  gabapentin (NEURONTIN) 300 MG capsule take1-2 capsules up to three times a day. Patient taking differently: Take 300-600 mg by mouth 3 (three) times daily.  06/17/19   Anson Fret, MD  metoprolol succinate (TOPROL-XL) 100 MG 24 hr tablet Take 1 tablet  (100 mg total) by mouth daily. Take with or immediately following a meal. 01/19/15   Gerhard Munch, MD  nortriptyline (PAMELOR) 50 MG capsule Take 1 capsule by mouth at bedtime 06/09/20   Drema Dallas, DO  omeprazole (PRILOSEC) 40 MG capsule Take 1 capsule by mouth daily. 12/30/18   [provider]  simvastatin (ZOCOR) 10 MG tablet Take 10 mg by mouth daily.  03/18/15   [provider]  tamsulosin (FLOMAX) 0.4 MG CAPS capsule Take 0.4 mg by mouth daily after supper. 08/10/17   [provider]  terbinafine (LAMISIL) 250 MG tablet Take 1 tablet (250 mg total) by mouth daily. 03/30/20   Lenn Sink, DPM  tiZANidine (ZANAFLEX) 2 MG tablet Take 1 tablet (2 mg total) by mouth 3 (three) times daily. 12/02/19   Drema Dallas, DO  triamcinolone ointment (KENALOG) 0.5 % Apply 1 application topically 2 (two) times daily. 06/02/20   Hall-Potvin, [place], PA-C    Family History Family History  Problem Relation Age of Onset  . Lung disease Father   . Asthma Mother   . Healthy Sister   . Healthy Brother   . Healthy Child   . Neuropathy Neg Hx     Social History Social History   Tobacco Use  . Smoking status: Former Smoker    Packs/day: 1.50    Types: Cigarettes    Quit date: 10/30/2016    Years since quitting:  3.7  . Smokeless tobacco: Never Used  Vaping Use  . Vaping Use: Never used  Substance Use Topics  . Alcohol use: Not Currently    Alcohol/week: 0.0 standard drinks    Comment: "not since last year" 06/29/2017". one beer on 4th of july 2020.  Marland Kitchen Drug use: No     Allergies   Patient has no known allergies.   Review of Systems As per HPI   Physical Exam Triage Vital Signs ED Triage Vitals [08/02/20 1342]  Enc Vitals Group     BP 119/78     Pulse Rate 76     Resp 16     Temp 97.8 F (36.6 C)     Temp Source Oral     SpO2 99 %     Weight      Height      Head Circumference      Peak Flow      Pain Score      Pain Loc      Pain Edu?       Excl. in GC?    No data found.  Updated Vital Signs BP 119/78 (BP Location: Right Arm)   Pulse 76   Temp 97.8 F (36.6 C) (Oral)   Resp 16   SpO2 99%   Visual Acuity Right Eye Distance:   Left Eye Distance:   Bilateral Distance:    Right Eye Near:   Left Eye Near:    Bilateral Near:     Physical Exam Constitutional:      General: He is not in acute distress. HENT:     Head: Normocephalic and atraumatic.  Eyes:     General: No scleral icterus.    Pupils: Pupils are equal, round, and reactive to light.  Cardiovascular:     Rate and Rhythm: Normal rate.  Pulmonary:     Effort: Pulmonary effort is normal. No respiratory distress.     Breath sounds: No wheezing.  Musculoskeletal:        General: Swelling, tenderness and signs of injury present. Normal range of motion.  Skin:    Coloration: Skin is not jaundiced or pale.     Comments: 1.5 cm superficial laceration involving nail bed of left ring finger.  Neurovascular intact.  Neurological:     Mental Status: He is alert and oriented to person, place, and time.      UC Treatments / Results  Labs (all labs ordered are listed, but only abnormal results are displayed) Labs Reviewed - No data to display  EKG   Radiology No results found.  Procedures Procedures (including critical care time)  Medications Ordered in UC Medications - No data to display  Initial Impression / Assessment and Plan / UC Course  I have reviewed the triage vital signs and the nursing notes.  Pertinent labs & imaging results that were available during my care of the patient were reviewed by me and considered in my medical decision making (see chart for details).     Offered x-ray due to nailbed involvement with high-speed injury.  Patient declined stating "nothing is broken ".  Wound cleaned, dressed in office as patient would like to avoid sutures.  Laceration is superficial: Agreeable to this.  Declined splint.  Will change dressings  daily, air out, return for worsening symptoms as outlined below.  Return precautions discussed, pt verbalized understanding and is agreeable to plan. Final Clinical Impressions(s) / UC Diagnoses   Final diagnoses:  Laceration of left  ring finger without foreign body with damage to nail, initial encounter     Discharge Instructions     Keep wound clean, dry. Important wash daily, pat dry and air out. Return for worsening pain, swelling, bleeding.    ED Prescriptions    None     PDMP not reviewed this encounter.   Hall-Potvin, [place], [place] 08/02/20 1425

## 2020-08-26 NOTE — Progress Notes (Signed)
NEUROLOGY FOLLOW UP OFFICE NOTE  Michael Whitaker [id number]  HISTORY OF PRESENT ILLNESS: Michael Whitaker is a 64 year old right-handed male whom I see for headache and neck pain who presents for headache and now right knee weakness.  Urgent Care note reviewed.  UPDATE: Headaches are controlled.  In July, he was chased by a dog when he turned quickly and felt a pop and pain in his right anterior thigh and knee.  No numbness or tingling.  He started having trouble extending his knee.  He went to Urgent Care where X-ray of the knee was negative for fracture or dislocation.  He was given a Toradol injection.  He denies pain but feels a large hard knot in his thigh and has difficulty straightening his knee, causing him to limp.  He has history of left-sided sciatic pain.  MRI of lumbar spine from 06/27/2019 personally reviewed showed mild disc protrusion at L4-5 causing moderate left lateral recess stenosis possibly encroaching the left L5 nerve root.    Current NSAIDS:Celecoxib200 mg twice daily as needed Current analgesics:None Current triptans:None Current ergotamine:None Current anti-emetic:None Current muscle relaxants:Tizanidine 2 mg three times daily for neck pain Current anti-anxiolytic:None Current sleep aide:None Current Antihypertensive medications:Lisinopril, Toprol-XL Current Antidepressant medications:Nortriptyline 50 mg at bedtime (only once in awhile) Current Anticonvulsant medications:gabapentin 300mg -600mg  TID (not daily) Current anti-CGRP:None Current Vitamins/Herbal/Supplements:None Current Antihistamines/Decongestants:None Other therapy:None  HISTORY: Onset:  In 2017, he developed left sided radicular neck pain. 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 In July 2017, he underwent  cervical laminectomy with posterior spinal fusion a C3-4, C4-5 and C5-6 due to neck and left sided radicular pain. Following surgery, he has had neck and posterior head pain. Cervical X-rays on 10/14/16 demonstrated adequate hardware without evidence of loosening. Location: Occipital region, radiating into neck bilaterally Quality: throbbing Initial Intensity: 3-4/10 Aura: no Prodrome: no Postdrome: no Associated symptoms:  None.No nausea, vomiting, photophobia, phonophobia or visual disturbance. He has not had any new worse headache of his life, or waking up from sleep Initial Duration: Constantly Initial Frequency: daily Initial Frequency of abortive medication: daily Aggravating factors:  Neck movement Relieving factors: Tylenol or Advil Activity: Functions. Limited neck movement due to tightness and pain. Still has pain radiating down both shoulders. No arm/hand weakness or numbness.  Past NSAIDS: Advil Past analgesics: Tramadol, Tylenol Past abortive triptans: no Past muscle relaxants: Yes (does not remember which one but it caused drowsiness) Past antiepileptic: Gabapentin (ineffective) Other past therapies: no  PAST MEDICAL HISTORY: Past Medical History:  Diagnosis Date  . GERD (gastroesophageal reflux disease)   . Hyperlipemia   . Hypertension     MEDICATIONS: Current Outpatient Medications on File Prior to Visit  Medication Sig Dispense Refill  . Candesartan Cilexetil-HCTZ 32-25 MG TABS Take 1 tablet by mouth daily.    . celecoxib (CELEBREX) 200 MG capsule Take 200 mg by mouth 2 (two) times daily as needed.    . furosemide (LASIX) 40 MG tablet Take 1 tablet (40 mg total) by mouth daily. 30 tablet 0  . gabapentin (NEURONTIN) 300 MG capsule take1-2 capsules up to three times a day. (Patient taking differently: Take 300-600 mg by mouth 3 (three) times daily. ) 180 capsule 11  . metoprolol succinate (TOPROL-XL) 100 MG 24 hr tablet Take 1 tablet (100  mg total) by mouth daily. Take with  or immediately following a meal. 30 tablet 0  . nortriptyline (PAMELOR) 50 MG capsule Take 1 capsule by mouth at bedtime 30 capsule 4  . omeprazole (PRILOSEC) 40 MG capsule Take 1 capsule by mouth daily.    . simvastatin (ZOCOR) 10 MG tablet Take 10 mg by mouth daily.     . tamsulosin (FLOMAX) 0.4 MG CAPS capsule Take 0.4 mg by mouth daily after supper.    . terbinafine (LAMISIL) 250 MG tablet Take 1 tablet (250 mg total) by mouth daily. 45 tablet 0  . tiZANidine (ZANAFLEX) 2 MG tablet Take 1 tablet (2 mg total) by mouth 3 (three) times daily. 90 tablet 0  . triamcinolone ointment (KENALOG) 0.5 % Apply 1 application topically 2 (two) times daily. 30 g 0   No current facility-administered medications on file prior to visit.    ALLERGIES: No Known Allergies  FAMILY HISTORY: Family History  Problem Relation Age of Onset  . Lung disease Father   . Asthma Mother   . Healthy Sister   . Healthy Brother   . Healthy Child   . Neuropathy Neg Hx     SOCIAL HISTORY: Social History   Socioeconomic History  . Marital status: Married    Spouse name: Not on file  . Number of children: 2  . Years of education: Not on file  . Highest education level: Not on file  Occupational History  . Not on file  Tobacco Use  . Smoking status: Former Smoker    Packs/day: 1.50    Types: Cigarettes    Quit date: 10/30/2016    Years since quitting: 3.8  . Smokeless tobacco: Never Used  Vaping Use  . Vaping Use: Never used  Substance and Sexual Activity  . Alcohol use: Not Currently    Alcohol/week: 0.0 standard drinks    Comment: "not since last year" 06/29/2017". one beer on 4th of july 2020.  Marland Kitchen Drug use: No  . Sexual activity: Not on file  Other Topics Concern  . 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   2 children   High school graduate   Social Determinants of Health   Financial Resource Strain:   .  Difficulty of Paying Living Expenses: Not on file  Food Insecurity:   . Worried About Programme researcher, broadcasting/film/video in the Last Year: Not on file  . Ran Out of Food in the Last Year: Not on file  Transportation Needs:   . Lack of Transportation (Medical): Not on file  . Lack of Transportation (Non-Medical): Not on file  Physical Activity:   . Days of Exercise per Week: Not on file  . Minutes of Exercise per Session: Not on file  Stress:   . Feeling of Stress : Not on file  Social Connections:   . Frequency of Communication with Friends and Family: Not on file  . Frequency of Social Gatherings with Friends and Family: Not on file  . Attends Religious Services: Not on file  . Active Member of Clubs or Organizations: Not on file  . Attends Banker Meetings: Not on file  . Marital Status: Not on file  Intimate Partner Violence:   . Fear of Current or Ex-Partner: Not on file  . Emotionally Abused: Not on file  . Physically Abused: Not on file  . Sexually Abused: Not on file   PHYSICAL EXAM: Blood pressure 122/76, pulse 80, resp.  rate 18, height 6\' 1"  (1.854 m), weight 273 lb (123.8 kg), SpO2 98 %. General: No acute distress.  Patient appears well-groomed.   Head:  Normocephalic/atraumatic Eyes:  Fundi examined but not visualized Neck: supple, no paraspinal tenderness, full range of motion Heart:  Regular rate and rhythm Lungs:  Clear to auscultation bilaterally Back: No paraspinal tenderness Neurological Exam: alert and oriented to person, place, and time. Attention span and concentration intact, recent and remote memory intact, fund of knowledge intact.  Speech fluent and not dysarthric, language intact.  CN II-XII intact. Bulk and tone normal, muscle strength 4+/5 right knee extension with inability to completely extend the knee, otherwise 5/5 throughout.  Large knot palpated in right anterior thigh.  Sensation to pinprick and vibration intact.  Deep tendon reflexes 2+ throughout,  toes downgoing.  Finger to nose and heel to shin testing intact.  Ambulates with right limp.  Romberg negative.  IMPRESSION: 1.  Cervicogenic headache, stable 2.  Concern for possible right quadriceps tendon rupture.  PLAN: 1.  Gabapentin and nortriptyline 2.  Refer to orthopedics/sports medicine regarding possible tendon rupture. 3.  Follow up in one year.  , DO  CC: Shon Millet, MD

## 2020-08-27 ENCOUNTER — Other Ambulatory Visit: Payer: Self-pay

## 2020-08-27 ENCOUNTER — Ambulatory Visit: Payer: BC Managed Care – PPO | Admitting: Neurology

## 2020-08-27 ENCOUNTER — Encounter: Payer: Self-pay | Admitting: Neurology

## 2020-08-27 VITALS — BP 122/76 | HR 80 | Resp 18 | Ht 73.0 in | Wt 273.0 lb

## 2020-08-27 DIAGNOSIS — R29898 Other symptoms and signs involving the musculoskeletal system: Secondary | ICD-10-CM | POA: Diagnosis not present

## 2020-08-27 NOTE — Patient Instructions (Signed)
Continue nortriptyline and gabapentin Refer to [facility] Follow up in one year

## 2020-09-02 DIAGNOSIS — S76191A Other specified injury of right quadriceps muscle, fascia and tendon, initial encounter: Secondary | ICD-10-CM | POA: Diagnosis not present

## 2020-09-02 DIAGNOSIS — M25561 Pain in right knee: Secondary | ICD-10-CM | POA: Diagnosis not present

## 2020-09-07 DIAGNOSIS — S76111A Strain of right quadriceps muscle, fascia and tendon, initial encounter: Secondary | ICD-10-CM | POA: Diagnosis not present

## 2020-09-07 DIAGNOSIS — G8918 Other acute postprocedural pain: Secondary | ICD-10-CM | POA: Diagnosis not present

## 2020-09-07 DIAGNOSIS — X509XXA Other and unspecified overexertion or strenuous movements or postures, initial encounter: Secondary | ICD-10-CM | POA: Diagnosis not present

## 2020-09-30 DIAGNOSIS — J302 Other seasonal allergic rhinitis: Secondary | ICD-10-CM | POA: Diagnosis not present

## 2020-09-30 DIAGNOSIS — Z79899 Other long term (current) drug therapy: Secondary | ICD-10-CM | POA: Diagnosis not present

## 2020-09-30 DIAGNOSIS — Z6835 Body mass index (BMI) 35.0-35.9, adult: Secondary | ICD-10-CM | POA: Diagnosis not present

## 2020-09-30 DIAGNOSIS — H6983 Other specified disorders of Eustachian tube, bilateral: Secondary | ICD-10-CM | POA: Diagnosis not present

## 2020-10-05 DIAGNOSIS — M25561 Pain in right knee: Secondary | ICD-10-CM | POA: Diagnosis not present

## 2020-10-05 DIAGNOSIS — S76111D Strain of right quadriceps muscle, fascia and tendon, subsequent encounter: Secondary | ICD-10-CM | POA: Diagnosis not present

## 2020-10-08 DIAGNOSIS — M25561 Pain in right knee: Secondary | ICD-10-CM | POA: Diagnosis not present

## 2020-10-10 ENCOUNTER — Ambulatory Visit
Admission: EM | Admit: 2020-10-10 | Discharge: 2020-10-10 | Discharge status: Against Medical Advice | Payer: BC Managed Care – PPO

## 2020-10-12 DIAGNOSIS — M25561 Pain in right knee: Secondary | ICD-10-CM | POA: Diagnosis not present

## 2020-10-19 DIAGNOSIS — M25561 Pain in right knee: Secondary | ICD-10-CM | POA: Diagnosis not present

## 2020-10-23 ENCOUNTER — Ambulatory Visit: Payer: BC Managed Care – PPO | Admitting: Neurology

## 2020-10-23 ENCOUNTER — Other Ambulatory Visit: Payer: Self-pay | Admitting: Neurology

## 2020-10-23 DIAGNOSIS — M5416 Radiculopathy, lumbar region: Secondary | ICD-10-CM

## 2020-10-23 DIAGNOSIS — M4807 Spinal stenosis, lumbosacral region: Secondary | ICD-10-CM

## 2020-10-23 DIAGNOSIS — M48062 Spinal stenosis, lumbar region with neurogenic claudication: Secondary | ICD-10-CM

## 2020-10-26 DIAGNOSIS — M25561 Pain in right knee: Secondary | ICD-10-CM | POA: Diagnosis not present

## 2020-10-27 ENCOUNTER — Other Ambulatory Visit: Payer: Self-pay | Admitting: Neurology

## 2020-10-27 DIAGNOSIS — S76111D Strain of right quadriceps muscle, fascia and tendon, subsequent encounter: Secondary | ICD-10-CM | POA: Diagnosis not present

## 2020-10-27 DIAGNOSIS — M48062 Spinal stenosis, lumbar region with neurogenic claudication: Secondary | ICD-10-CM

## 2020-10-27 DIAGNOSIS — M4807 Spinal stenosis, lumbosacral region: Secondary | ICD-10-CM

## 2020-10-27 DIAGNOSIS — M5416 Radiculopathy, lumbar region: Secondary | ICD-10-CM

## 2020-11-03 ENCOUNTER — Other Ambulatory Visit: Payer: Self-pay | Admitting: Neurology

## 2020-11-03 ENCOUNTER — Telehealth: Payer: Self-pay | Admitting: Neurology

## 2020-11-03 DIAGNOSIS — M5416 Radiculopathy, lumbar region: Secondary | ICD-10-CM

## 2020-11-03 DIAGNOSIS — M4807 Spinal stenosis, lumbosacral region: Secondary | ICD-10-CM

## 2020-11-03 DIAGNOSIS — M48062 Spinal stenosis, lumbar region with neurogenic claudication: Secondary | ICD-10-CM

## 2020-11-03 MED ORDER — GABAPENTIN 300 MG PO CAPS: 300.0000 mg | ORAL_CAPSULE | Freq: Three times a day (TID) | ORAL | 5 refills | Status: DC

## 2020-11-03 NOTE — Telephone Encounter (Signed)
Refilled with 5 additional refills.

## 2020-11-04 IMAGING — DX DG KNEE COMPLETE 4+V*R*
4 series · 4 of 4 positions shown · non-contrast
Comparison: 05/08/2020.

CLINICAL DATA: Injury.  Swelling.

EXAM:
RIGHT KNEE - COMPLETE 4+ VIEW

[knee ap (1 of 3)]
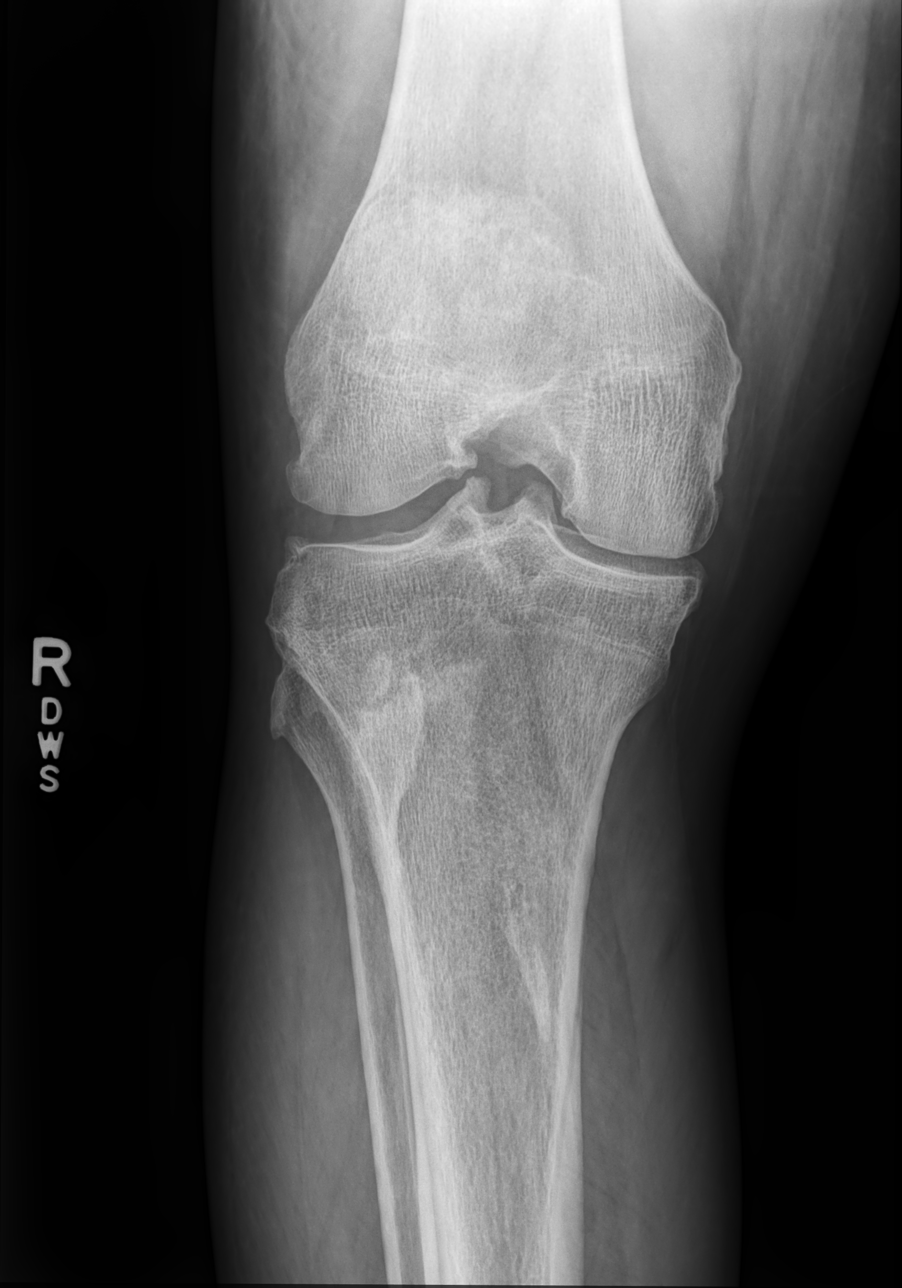

[knee ap (2 of 3)]
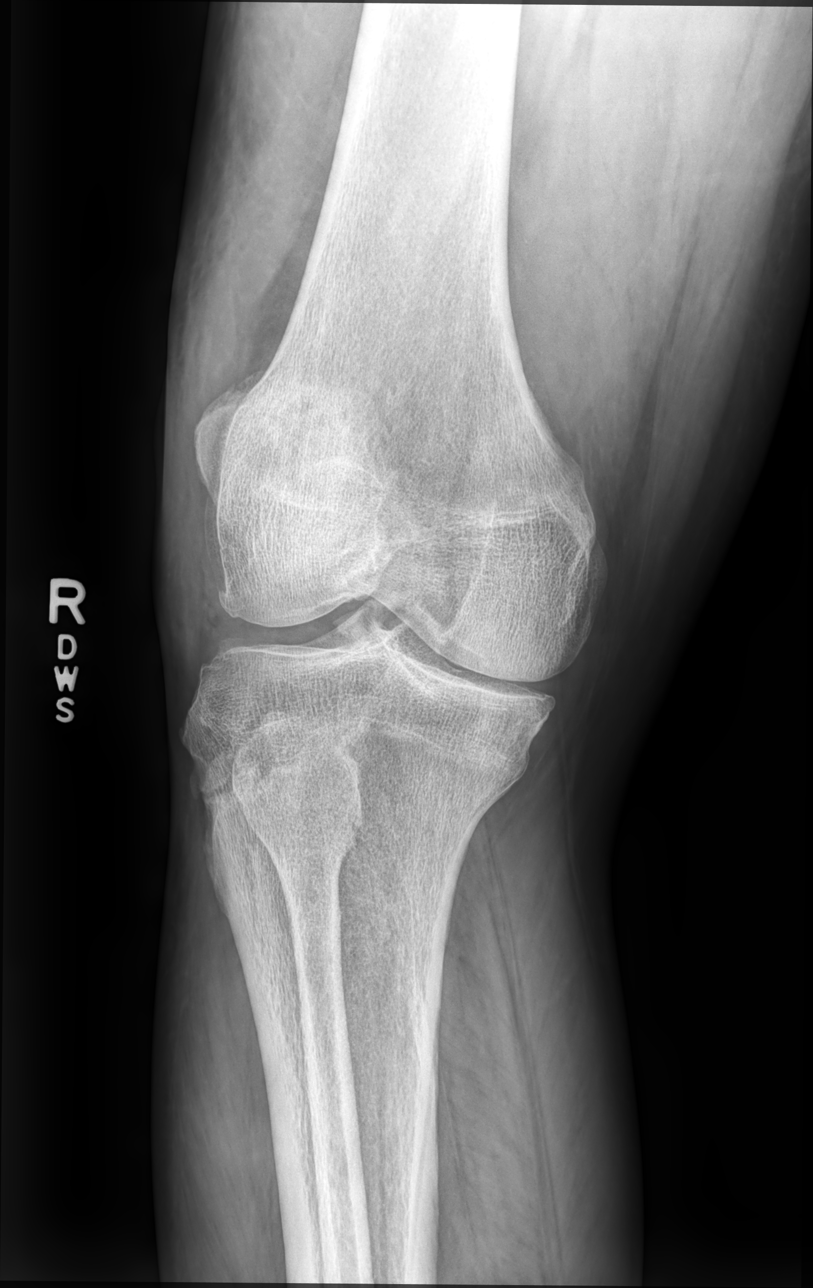

[knee ap (3 of 3)]
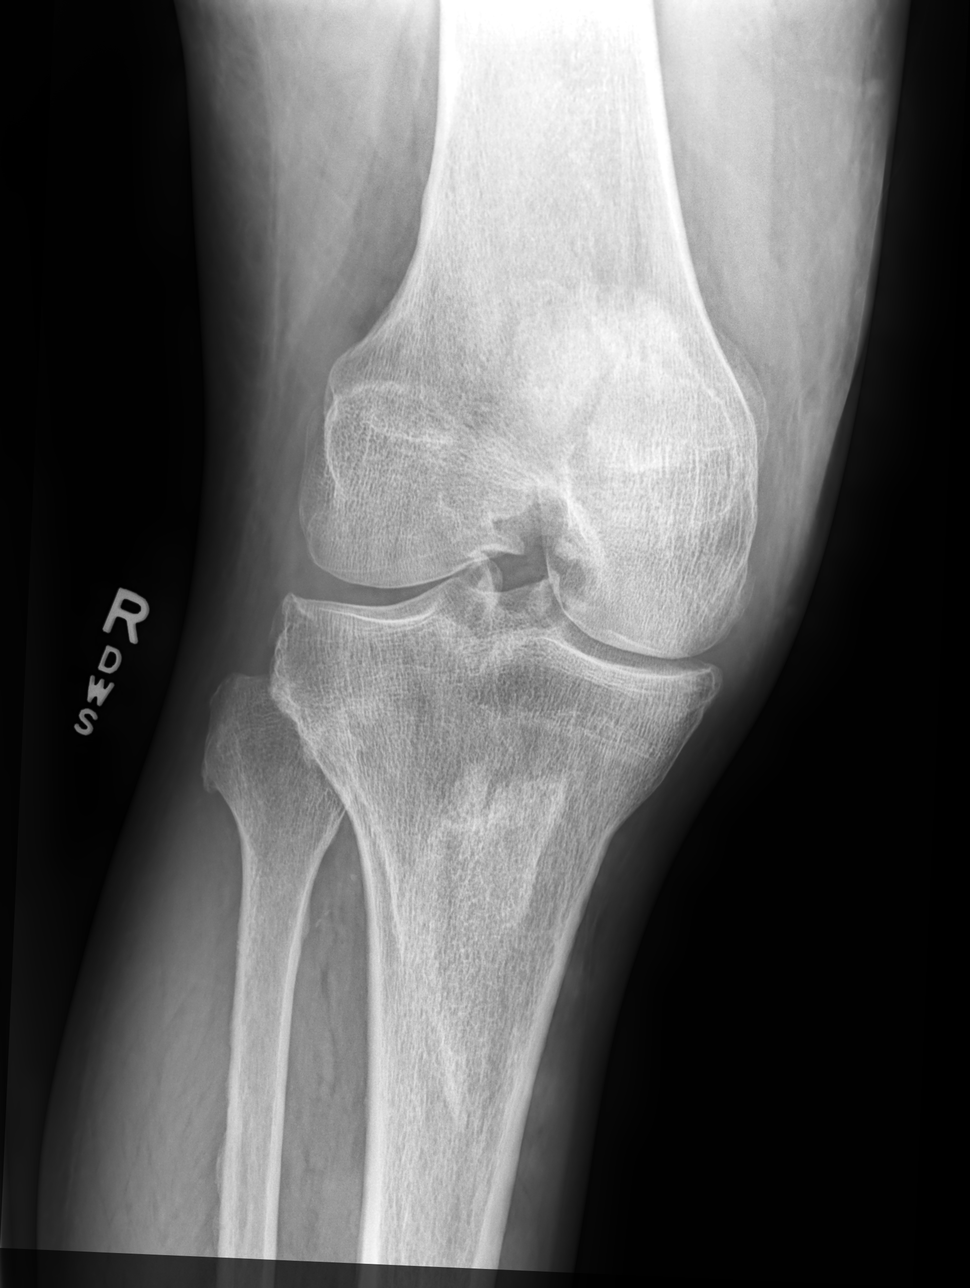

[knee lat]
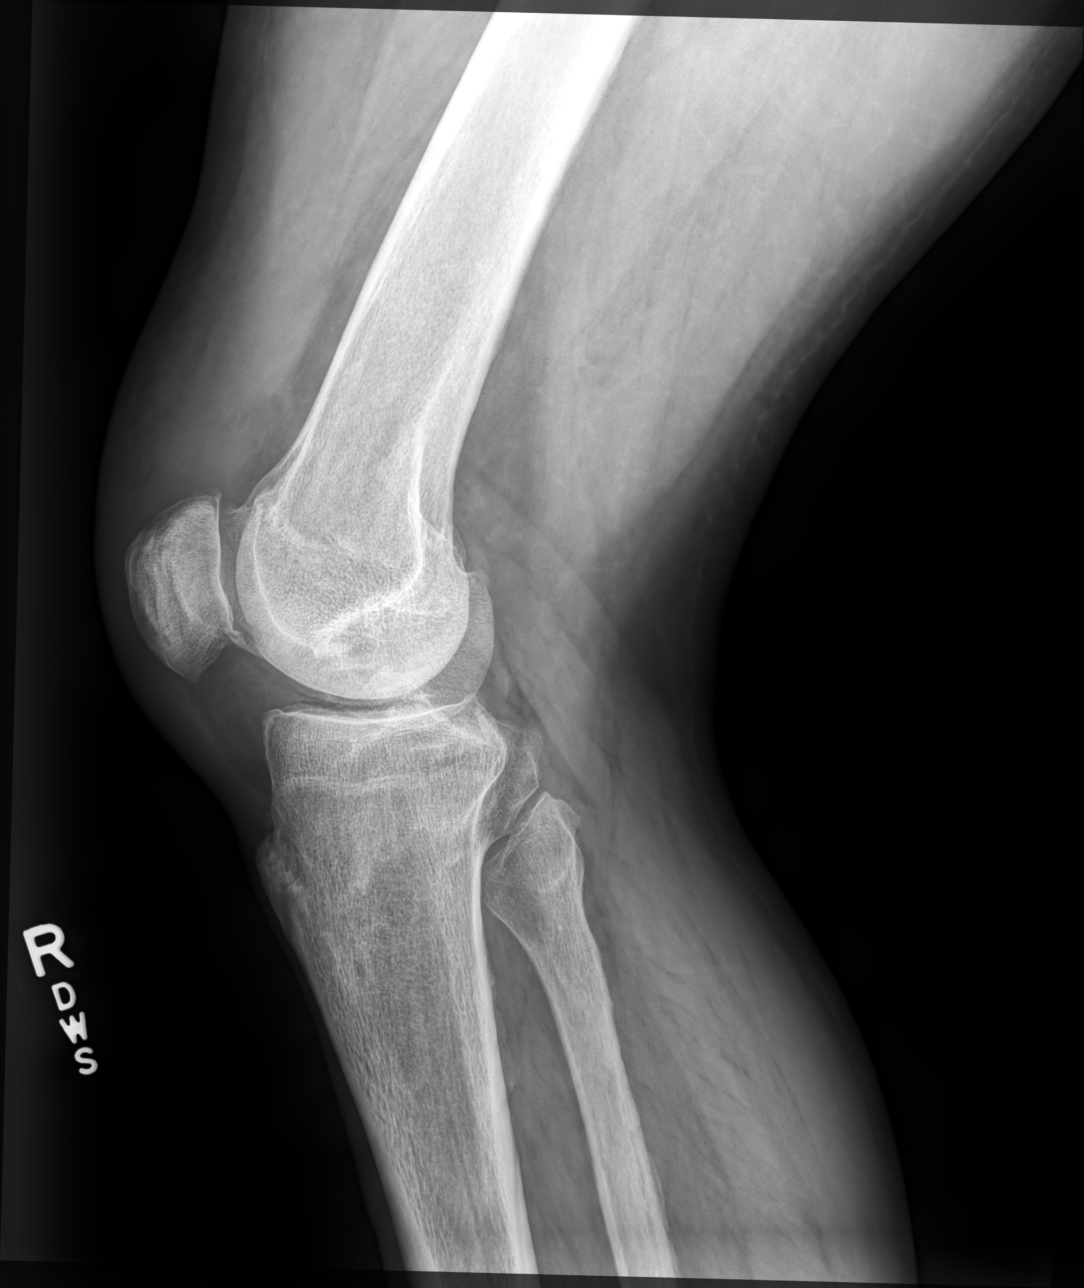

[4 of 4 positions shown; findings below may reference images not displayed]

FINDINGS: Soft tissue swelling. Moderate knee joint effusion. Diffuse
osteopenia and tricompartment degenerative change. No acute bony or
joint abnormality identified no evidence of fracture or
dislocation.
IMPRESSION: 1.  Soft tissue swelling.  Moderate knee joint effusion.

2. Diffuse osteopenia and tricompartment degenerative change. No
acute bony abnormality identified.

## 2020-11-19 ENCOUNTER — Ambulatory Visit (INDEPENDENT_AMBULATORY_CARE_PROVIDER_SITE_OTHER): Payer: BC Managed Care – PPO

## 2020-11-19 ENCOUNTER — Encounter: Payer: Self-pay | Admitting: Podiatry

## 2020-11-19 ENCOUNTER — Ambulatory Visit: Payer: BC Managed Care – PPO | Admitting: Podiatry

## 2020-11-19 ENCOUNTER — Other Ambulatory Visit: Payer: Self-pay

## 2020-11-19 DIAGNOSIS — M205X1 Other deformities of toe(s) (acquired), right foot: Secondary | ICD-10-CM

## 2020-11-19 DIAGNOSIS — L600 Ingrowing nail: Secondary | ICD-10-CM

## 2020-11-19 DIAGNOSIS — M79671 Pain in right foot: Secondary | ICD-10-CM

## 2020-11-20 NOTE — Progress Notes (Signed)
Subjective:   Patient ID: Michael Whitaker, male   DOB: 64 y.o.   MRN: [id number]   HPI Patient presents stating I am getting a lot of pain in my right big toe and also I am losing motion and I developed discomfort associated with the joint.  States the nail has been the biggest problem but the toe itself bothersome.  Patient does not smoke and is active with work   Review of Systems  All other systems reviewed and are negative.       Objective:  Physical Exam  Neurovascular status found to be intact muscle strength found to be adequate range of motion within normal limits.  The right hallux nail medial border is quite incurvated it sore when pressed and make shoe gear difficult and there is inflammation around the distal joint and the proximal metatarsal phalangeal joint with limitation of motion and mild to moderate inflammation around the joint surface     Assessment:  Ingrown toenail deformity right hallux medial border and also hallux limitus deformity right     Plan:  H&P reviewed both conditions.  For the hallux limitus we discussed rigid bottom shoes soaks and possible surgery in the future after review of x-ray and for the ingrown toenail recommended correction allowed him to read consent form for correction understanding risk.  Today I infiltrated the right hallux 60 mg like Marcaine mixture sterile prep done using sterile instrumentation removed the medial border exposed matrix applied phenol 3 applications 30 seconds followed by alcohol lavage and sterile dressing.  Gave instructions on soaks and to leave dressing on for 24 hours but take it off earlier if any throbbing were to occur and patient is encouraged to call questions concerns  X-rays indicate spurring around the first metatarsal head right with moderate narrowness of the joint surface

## 2020-11-30 ENCOUNTER — Other Ambulatory Visit: Payer: Self-pay | Admitting: Podiatry

## 2020-11-30 DIAGNOSIS — M205X1 Other deformities of toe(s) (acquired), right foot: Secondary | ICD-10-CM

## 2020-12-01 ENCOUNTER — Other Ambulatory Visit: Payer: Self-pay | Admitting: Neurology

## 2020-12-16 DIAGNOSIS — E785 Hyperlipidemia, unspecified: Secondary | ICD-10-CM | POA: Diagnosis not present

## 2020-12-16 DIAGNOSIS — R609 Edema, unspecified: Secondary | ICD-10-CM | POA: Diagnosis not present

## 2020-12-16 DIAGNOSIS — K219 Gastro-esophageal reflux disease without esophagitis: Secondary | ICD-10-CM | POA: Diagnosis not present

## 2020-12-16 DIAGNOSIS — R1013 Epigastric pain: Secondary | ICD-10-CM | POA: Diagnosis not present

## 2020-12-16 DIAGNOSIS — I10 Essential (primary) hypertension: Secondary | ICD-10-CM | POA: Diagnosis not present

## 2021-02-07 ENCOUNTER — Other Ambulatory Visit: Payer: Self-pay

## 2021-02-07 ENCOUNTER — Ambulatory Visit
Admission: EM | Admit: 2021-02-07 | Discharge: 2021-02-07 | Disposition: A | Discharge status: Home / Self Care | Payer: BC Managed Care – PPO

## 2021-02-07 DIAGNOSIS — R21 Rash and other nonspecific skin eruption: Secondary | ICD-10-CM | POA: Diagnosis not present

## 2021-02-07 DIAGNOSIS — R002 Palpitations: Secondary | ICD-10-CM | POA: Diagnosis not present

## 2021-02-07 MED ORDER — DOXYCYCLINE HYCLATE 100 MG PO CAPS: 100.0000 mg | ORAL_CAPSULE | Freq: Two times a day (BID) | ORAL | 0 refills | Status: AC

## 2021-02-07 MED ORDER — TRIAMCINOLONE ACETONIDE 0.1 % EX CREA
1.0000 | TOPICAL_CREAM | Freq: Two times a day (BID) | CUTANEOUS | 0 refills | Status: DC
Start: 2021-02-07 — End: 2022-04-29

## 2021-02-07 NOTE — ED Triage Notes (Addendum)
Pt c/o itchy rash to left abdomen for approx 1 week, pain, swelling to right buttocks as well. Pt reports that he felt something "bite him" on right clavicle area last night and awoke this morning with swelling, redness to right clavicular area. Pt also reports intermittent "fluttery heart" sensation over the past month.  Denies CP, palpitations, dizziness at present. Denies fever, n/v/d, drainage from rash/absecess sites.  Left flank/abdomen with erythema, scratched area of healing scab, right buttocks with area of approx 6 cm with raised erythema. Right clavicle with approx 3 cm edematous area with erythema, indurated. Pt states that his BP runs low at times and he will plan to not take his fluid pill for the next several days.  S1S2 RRR

## 2021-02-07 NOTE — Discharge Instructions (Addendum)
Begin doxycycline twice daily for 1 week Apply triamcinolone twice daily to areas to help with itching/swelling Please keep an eye on rash, follow-up if not improving or worsening  Follow-up with primary care if continuing to have fluttering sensation in chest

## 2021-02-07 NOTE — ED Provider Notes (Signed)
[name]    CSN: [id number] Arrival date & time: 02/07/21  8341      History   Chief Complaint Chief Complaint  Patient presents with  . Rash  . Abscess  . Palpitations    HPI Michael Whitaker is a 65 y.o. male history of hypertension, hyperlipidemia, GERD, tobacco use presenting today for evaluation of a rash.  Reports over the past week he has had various areas of redness and itching pop up on his body.  Initially started with an area to his left flank which he has been scratching.  Then he developed an area to his right buttocks which is also been itchy as well as some soreness.  Last night he felt as if a bug bit him towards his right chest and woke up today with an area of redness and swelling to over his right clavicle area.  He denies any fevers.  Denies any chest pain or shortness of breath.  Has reported some palpitations and fluttering sensation over the past week.  Intermittent throughout the day.  Denies at present.  Denies any recent medicine changes or dosage changes.  HPI  Past Medical History:  Diagnosis Date  . GERD (gastroesophageal reflux disease)   . Hyperlipemia   . Hypertension     Patient Active Problem List   Diagnosis Date Noted  . HTN (hypertension) 03/26/2015  . Tobacco abuse 03/26/2015  . Trigger finger, acquired 01/27/2015  . Fracture of fifth metacarpal bone of left hand 01/27/2015  . 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 Mild chronic obstructive pulmonary disease (HCC) 10/06/2011  . History of gastroesophageal reflux (GERD) 10/06/2011  . Osteoarthrosis, unspecified whether generalized or localized, upper arm 09/22/2011    Past Surgical History:  Procedure Laterality Date  . CERVICAL SPINE SURGERY    . ELBOW SURGERY    . UMBILICAL HERNIA REPAIR         Home Medications    Prior to Admission medications   Medication Sig Start Date End Date Taking? Authorizing Provider  celecoxib (CELEBREX) 200 MG capsule Take 200 mg by  mouth 2 (two) times daily as needed.   Yes [provider]  cetirizine (ZYRTEC) 10 MG tablet Take 10 mg by mouth daily. 01/11/21  Yes [provider]  doxycycline (VIBRAMYCIN) 100 MG capsule Take 1 capsule (100 mg total) by mouth 2 (two) times daily for 7 days. 02/07/21 02/14/21 Yes Jessicia Napolitano C, PA-C  furosemide (LASIX) 40 MG tablet Take 1 tablet (40 mg total) by mouth daily. 04/04/20  Yes Dione Booze, MD  metoprolol succinate (TOPROL-XL) 100 MG 24 hr tablet Take 1 tablet (100 mg total) by mouth daily. Take with or immediately following a meal. 01/19/15  Yes Gerhard Munch, MD  omeprazole (PRILOSEC) 40 MG capsule Take 1 capsule by mouth daily. 12/30/18  Yes [provider]  simvastatin (ZOCOR) 10 MG tablet Take 10 mg by mouth daily.  03/18/15  Yes [provider]  tamsulosin (FLOMAX) 0.4 MG CAPS capsule Take 0.4 mg by mouth daily after supper. 08/10/17  Yes [provider]  triamcinolone (KENALOG) 0.1 % Apply 1 application topically 2 (two) times daily. 02/07/21  Yes Muad Noga C, PA-C  Candesartan Cilexetil-HCTZ 32-25 MG TABS Take 1 tablet by mouth daily. 12/10/18   [provider]  gabapentin (NEURONTIN) 300 MG capsule Take 1-2 capsules (300-600 mg total) by mouth 3 (three) times daily. 11/03/20   Drema Dallas, DO  nortriptyline (PAMELOR) 50 MG capsule Take  1 capsule by mouth at bedtime 06/09/20   Drema Dallas, DO  tiZANidine (ZANAFLEX) 2 MG tablet TAKE 1 TABLET BY MOUTH THREE TIMES DAILY 12/01/20   Drema Dallas, DO    Family History Family History  Problem Relation Age of Onset  . Lung disease Father   . Asthma Mother   . Healthy Sister   . Healthy Brother   . Healthy Child   . Neuropathy Neg Hx     Social History Social History   Tobacco Use  . Smoking status: Former Smoker    Packs/day: 1.50    Types: Cigarettes    Quit date: 10/30/2016    Years since quitting: 4.2  . Smokeless tobacco: Never Used  Vaping Use  . Vaping  Use: Never used  Substance Use Topics  . Alcohol use: Not Currently    Alcohol/week: 0.0 standard drinks    Comment: "not since last year" 06/29/2017". one beer on 4th of july 2020.  Marland Kitchen Drug use: No     Allergies   Patient has no known allergies.   Review of Systems Review of Systems  Constitutional: Negative for fatigue and fever.  Eyes: Negative for redness, itching and visual disturbance.  Respiratory: Negative for shortness of breath.   Cardiovascular: Positive for palpitations. Negative for chest pain and leg swelling.  Gastrointestinal: Negative for nausea and vomiting.  Musculoskeletal: Negative for arthralgias and myalgias.  Skin: Positive for color change and rash. Negative for wound.  Neurological: Negative for dizziness, syncope, weakness, light-headedness and headaches.     Physical Exam Triage Vital Signs ED Triage Vitals  Enc Vitals Group     BP      Pulse      Resp      Temp      Temp src      SpO2      Weight      Height      Head Circumference      Peak Flow      Pain Score      Pain Loc      Pain Edu?      Excl. in GC?    No data found.  Updated Vital Signs BP 109/80 (BP Location: Left Arm)   Pulse 68   Temp 98 F (36.7 C) (Oral)   Resp 18   SpO2 98%   Visual Acuity Right Eye Distance:   Left Eye Distance:   Bilateral Distance:    Right Eye Near:   Left Eye Near:    Bilateral Near:     Physical Exam Vitals and nursing note reviewed.  Constitutional:      Appearance: He is well-developed and well-nourished.     Comments: No acute distress  HENT:     Head: Normocephalic and atraumatic.     Nose: Nose normal.  Eyes:     Conjunctiva/sclera: Conjunctivae normal.  Cardiovascular:     Rate and Rhythm: Normal rate and regular rhythm.     Comments: No murmur, no carotid bruits auscultated Pulmonary:     Effort: Pulmonary effort is normal. No respiratory distress.     Comments: Breathing comfortably at rest, CTABL, no wheezing,  rales or other adventitious sounds auscultated Abdominal:     General: There is no distension.  Musculoskeletal:        General: Normal range of motion.     Cervical back: Neck supple.  Skin:    General: Skin is warm and dry.  Comments: Right clavicle area with area of swelling erythema noted medially  Right buttock with circular area of erythema, slightly raised and minimally indurated, no fluctuance  Left flank with 2 macular areas with central scabbing, no vesicles  Neurological:     Mental Status: He is alert and oriented to person, place, and time.  Psychiatric:        Mood and Affect: Mood and affect normal.      UC Treatments / Results  Labs (all labs ordered are listed, but only abnormal results are displayed) Labs Reviewed - No data to display  EKG   Radiology No results found.  Procedures Procedures (including critical care time)  Medications Ordered in UC Medications - No data to display  Initial Impression / Assessment and Plan / UC Course  I have reviewed the triage vital signs and the nursing notes.  Pertinent labs & imaging results that were available during my care of the patient were reviewed by me and considered in my medical decision making (see chart for details).     1.  Rash-various areas of differing appearance, possible insect bite versus early abscess versus resolving shingles.  Treating with triamcinolone topically, doxycycline twice daily for 1 week to cover possible early infection/cellulitis.  Scabbing on area on flank likely secondary to excoriation rather than resolving shingles.  Deferring Valtrex as this area appears to be healing and has been going on for almost 2 weeks at this time.  2.  Palpitations-EKG normal sinus rhythm, no acute signs of ischemia or infarction.  Currently asymptomatic.  Encouraged to follow-up with PCP/cardiology if continuing.  Discussed strict return precautions. Patient verbalized understanding and is  agreeable with plan.  Final Clinical Impressions(s) / UC Diagnoses   Final diagnoses:  Rash and nonspecific skin eruption  Palpitations     Discharge Instructions     Begin doxycycline twice daily for 1 week Apply triamcinolone twice daily to areas to help with itching/swelling Please keep an eye on rash, follow-up if not improving or worsening  Follow-up with primary care if continuing to have fluttering sensation in chest    ED Prescriptions    Medication Sig Dispense Auth. Provider   triamcinolone (KENALOG) 0.1 % Apply 1 application topically 2 (two) times daily. 30 g Eldra Word C, PA-C   doxycycline (VIBRAMYCIN) 100 MG capsule Take 1 capsule (100 mg total) by mouth 2 (two) times daily for 7 days. 14 capsule Olson Lucarelli, Rand C, PA-C     PDMP not reviewed this encounter.   Lew Dawes, [place] 02/07/21 [phone]

## 2021-02-11 DIAGNOSIS — Z79899 Other long term (current) drug therapy: Secondary | ICD-10-CM | POA: Diagnosis not present

## 2021-02-11 DIAGNOSIS — L03319 Cellulitis of trunk, unspecified: Secondary | ICD-10-CM | POA: Diagnosis not present

## 2021-02-11 DIAGNOSIS — R0789 Other chest pain: Secondary | ICD-10-CM | POA: Diagnosis not present

## 2021-02-11 DIAGNOSIS — Z6835 Body mass index (BMI) 35.0-35.9, adult: Secondary | ICD-10-CM | POA: Diagnosis not present

## 2021-02-23 ENCOUNTER — Encounter: Payer: Self-pay | Admitting: Emergency Medicine

## 2021-02-23 ENCOUNTER — Ambulatory Visit
Admission: EM | Admit: 2021-02-23 | Discharge: 2021-02-23 | Disposition: A | Discharge status: Home / Self Care | Payer: BC Managed Care – PPO | Attending: Emergency Medicine | Admitting: Emergency Medicine

## 2021-02-23 ENCOUNTER — Other Ambulatory Visit: Payer: Self-pay

## 2021-02-23 DIAGNOSIS — I10 Essential (primary) hypertension: Secondary | ICD-10-CM | POA: Diagnosis not present

## 2021-02-23 NOTE — Discharge Instructions (Addendum)
Restart the candesartan/Cilaxetil hydrochlorothiazide.  This is a combination blood pressure and fluid pill.  Keep a close eye on your blood pressure.  Decrease your salt intake. diet and exercise will lower your blood pressure significantly. It is important to keep your blood pressure under good control, as having a elevated blood pressure for prolonged periods of time significantly increases your risk of stroke, heart attacks, kidney damage, eye damage, and other problems. Measure your blood pressure once a day, preferably at the same time every day. Keep a log of this and bring it to your next doctor's appointment.  Please make an appointment with your doctor next week.  Bring your blood pressure cuff as well.  . Return immediately to the ER if you start having chest pain, headache, problems seeing, problems talking, problems walking, if you feel like you're about to pass out, if you do pass out, if you have a seizure, or for any other concerns.  If you get lightheaded, dizzy, if your blood pressure gets low, then call your doctor and decide which medications to stop.  Go to www.goodrx.com to look up your medications. This will give you a list of where you can find your prescriptions at the most affordable prices. Or ask the pharmacist what the cash price is, or if they have any other discount programs available to help make your medication more affordable. This can be less expensive than what you would pay with insurance.

## 2021-02-23 NOTE — ED Provider Notes (Signed)
HPI  SUBJECTIVE:  Michael Whitaker is a 65 y.o. male who presents for blood pressure check.  He states that his blood pressure was running low 2 weeks ago and was advised to stop the candesartan/Cilaxetil by his PMD.  He has been taking Lasix as needed.  He does not measure his blood pressure at home.  He is not sure what his blood pressure has been running over the past 2 weeks.  He had a mild headache earlier today that resolved with Tylenol.  He denies strokelike symptoms, chest pain or shortness of breath, palpitations, seizures, abdominal pain, lower extremity edema, anuria, hematuria.  He has a past medical history of hypertension, smoking, COPD, GERD and hypercholesterolemia.  No history of MI, stroke, chronic kidney disease.  States that he has a stress test in 6 days.  ZSW:FUXNATFT, Mila Homer., MD    Past Medical History:  Diagnosis Date  . GERD (gastroesophageal reflux disease)   . Hyperlipemia   . Hypertension     Past Surgical History:  Procedure Laterality Date  . CERVICAL SPINE SURGERY    . ELBOW SURGERY    . UMBILICAL HERNIA REPAIR      Family History  Problem Relation Age of Onset  . Lung disease Father   . Asthma Mother   . Healthy Sister   . Healthy Brother   . Healthy Child   . Neuropathy Neg Hx     Social History   Tobacco Use  . Smoking status: Former Smoker    Packs/day: 1.50    Types: Cigarettes    Quit date: 10/30/2016    Years since quitting: 4.3  . Smokeless tobacco: Never Used  Vaping Use  . Vaping Use: Never used  Substance Use Topics  . Alcohol use: Not Currently    Alcohol/week: 0.0 standard drinks    Comment: "not since last year" 06/29/2017". one beer on 4th of july 2020.  Marland Kitchen Drug use: No    No current facility-administered medications for this encounter.  Current Outpatient Medications:  .  Candesartan Cilexetil-HCTZ 32-25 MG TABS, Take 1 tablet by mouth daily., Disp: , Rfl:  .  celecoxib (CELEBREX) 200 MG capsule, Take 200 mg by  mouth 2 (two) times daily as needed., Disp: , Rfl:  .  cetirizine (ZYRTEC) 10 MG tablet, Take 10 mg by mouth daily., Disp: , Rfl:  .  gabapentin (NEURONTIN) 300 MG capsule, Take 1-2 capsules (300-600 mg total) by mouth 3 (three) times daily., Disp: 180 capsule, Rfl: 5 .  metoprolol succinate (TOPROL-XL) 100 MG 24 hr tablet, Take 1 tablet (100 mg total) by mouth daily. Take with or immediately following a meal., Disp: 30 tablet, Rfl: 0 .  nortriptyline (PAMELOR) 50 MG capsule, Take 1 capsule by mouth at bedtime, Disp: 30 capsule, Rfl: 4 .  omeprazole (PRILOSEC) 40 MG capsule, Take 1 capsule by mouth daily., Disp: , Rfl:  .  simvastatin (ZOCOR) 10 MG tablet, Take 10 mg by mouth daily. , Disp: , Rfl:  .  tamsulosin (FLOMAX) 0.4 MG CAPS capsule, Take 0.4 mg by mouth daily after supper., Disp: , Rfl:  .  tiZANidine (ZANAFLEX) 2 MG tablet, TAKE 1 TABLET BY MOUTH THREE TIMES DAILY, Disp: 90 tablet, Rfl: 5 .  triamcinolone (KENALOG) 0.1 %, Apply 1 application topically 2 (two) times daily., Disp: 30 g, Rfl: 0  No Known Allergies   ROS  As noted in HPI.   Physical Exam  BP (!) 155/97 (BP Location: Left Arm)  Pulse 66   Temp 97.8 F (36.6 C) (Oral)   Resp 20   SpO2 97%   Constitutional: Well developed, well nourished, no acute distress Eyes:  EOMI, conjunctiva normal bilaterally HENT: Normocephalic, atraumatic,mucus membranes moist Respiratory: Normal inspiratory effort, lungs clear bilaterally Cardiovascular: Normal rate, regular rhythm no murmurs rubs or gallop GI: nondistended skin: No rash, skin intact Musculoskeletal: no deformities Neurologic: Alert & oriented x 3, no focal neuro deficits Psychiatric: Speech and behavior appropriate   ED Course   Medications - No data to display  No orders of the defined types were placed in this encounter.   No results found for this or any previous visit (from the past 24 hour(s)). No results found.  ED Clinical Impression  1.  Essential hypertension      ED Assessment/Plan  Blood pressure is acceptable here.  He reports a mild headache which resolved with Tylenol.  Doubt hypertensive encephalopathy.   will give him hypertension ER return precautions.  In the meantime, discontinue Lasix, restart the candesartan/Cilaxetil hydrochlorothiazide.  He is to buy blood pressure cuff and measure his blood pressure once a day.  He will keep a log of this.  He will follow-up with his doctor in a week with his blood pressure cuff and log to see if he needs any medication adjustments.  Discussed with him that if he becomes lightheaded or dizzy, or if his blood pressure gets low, he needs to call his doctor and decide which blood pressure medicine to stop.  Discussed MDM, treatment plan, and plan for follow-up with patient. Discussed sn/sx that should prompt return to the ED. patient agrees with plan.   No orders of the defined types were placed in this encounter.   *This clinic note was created using Dragon dictation software. Therefore, there may be occasional mistakes despite careful proofreading.   ?    Domenick Gong, MD 02/24/21 [phone]

## 2021-02-23 NOTE — ED Triage Notes (Signed)
Pt sts has only been taking one of his htn meds and wanted his BP checked; pt sts some HA at times

## 2021-02-27 NOTE — Progress Notes (Signed)
Cardiology Office Note:    Date:  03/01/2021   ID:  Michael Whitaker, DOB 07-20-1956, MRN [id number]  PCP:  Michael Floor., MD   [facility]  Cardiologist:  No primary care provider on file.  Advanced Practice Provider:  No care team member to display Electrophysiologist:  None   Referring MD: Michael Floor., MD    History of Present Illness:    Michael Whitaker is a 65 y.o. male with a hx of HTN, mild COPD, tobacco abuse, and GERD who was referred by Dr. Orvan Whitaker for further evaluation of chest pain.  Was previously followed by Dr. Antoine Whitaker and was last seen in clinic in 2018. He was having atypical chest pain at that time. Exercise stress was normal. TTE 2016 with normal BiV function and no significant valve disease.  The patient states that he has been having intermittent chest throbbing and some fluttering. Not exertional related and occurs at rest. Each episodes seconds to minutes. No known triggers or allievating factors. No SOB, lightheadedness, palpitations, diaphoresis. Has LE that improves with lasix. Blood pressure is well controlled at home.   Past Medical History:  Diagnosis Date  . Body mass index (BMI) 35.0-35.9, adult   . Chest pain   . Chronic constipation   . GERD (gastroesophageal reflux disease)   . Hyperlipemia   . Hypertension   . Osteoarthritis   . Tobacco use disorder     Past Surgical History:  Procedure Laterality Date  . CERVICAL SPINE SURGERY    . ELBOW SURGERY    . UMBILICAL HERNIA REPAIR      Current Medications: Current Meds  Medication Sig  . Candesartan Cilexetil-HCTZ 32-25 MG TABS Take 1 tablet by mouth daily.  . celecoxib (CELEBREX) 200 MG capsule Take 200 mg by mouth 2 (two) times daily as needed.  . cetirizine (ZYRTEC) 10 MG tablet Take 10 mg by mouth daily.  Marland Kitchen gabapentin (NEURONTIN) 300 MG capsule Take 1-2 capsules (300-600 mg total) by mouth 3 (three) times daily.  . metoprolol succinate  (TOPROL-XL) 100 MG 24 hr tablet Take 1 tablet (100 mg total) by mouth daily. Take with or immediately following a meal.  . nortriptyline (PAMELOR) 50 MG capsule Take 1 capsule by mouth at bedtime  . omeprazole (PRILOSEC) 40 MG capsule Take 1 capsule by mouth daily.  . rosuvastatin (CRESTOR) 10 MG tablet Take 1 tablet (10 mg total) by mouth daily.  . tamsulosin (FLOMAX) 0.4 MG CAPS capsule Take 0.4 mg by mouth daily after supper.  Marland Kitchen tiZANidine (ZANAFLEX) 2 MG tablet TAKE 1 TABLET BY MOUTH THREE TIMES DAILY  . triamcinolone (KENALOG) 0.1 % Apply 1 application topically 2 (two) times daily.  . [DISCONTINUED] simvastatin (ZOCOR) 10 MG tablet Take 10 mg by mouth daily.      Allergies:   Patient has no known allergies.   Social History   Socioeconomic History  . Marital status: Married    Spouse name: Not on file  . Number of children: 2  . Years of education: Not on file  . Highest education level: Not on file  Occupational History  . Not on file  Tobacco Use  . Smoking status: Former Smoker    Packs/day: 1.50    Types: Cigarettes    Quit date: 10/30/2016    Years since quitting: 4.3  . Smokeless tobacco: Never Used  Vaping Use  . Vaping Use: Never used  Substance and Sexual Activity  . Alcohol use:  Not Currently    Alcohol/week: 0.0 standard drinks    Comment: "not since last year" 06/29/2017". one beer on 4th of july 2020.  Marland Kitchen Drug use: No  . Sexual activity: Not on file  Other Topics Concern  . 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   2 children   High school graduate   Social Determinants of Health   Financial Resource Strain: Not on file  Food Insecurity: Not on file  Transportation Needs: Not on file  Physical Activity: Not on file  Stress: Not on file  Social Connections: Not on file     Family History: The patient's family history includes Asthma in his mother; Healthy in his brother, child, and sister; Lung  disease in his father. There is no history of Neuropathy.  ROS:   Please see the history of present illness.    Review of Systems  Constitutional: Negative for chills, diaphoresis and fever.  HENT: Negative for hearing loss.   Eyes: Negative for blurred vision and redness.  Respiratory: Negative for shortness of breath.   Cardiovascular: Positive for chest pain and leg swelling. Negative for palpitations, orthopnea, claudication and PND.  Gastrointestinal: Negative for melena, nausea and vomiting.  Genitourinary: Negative for dysuria and flank pain.  Musculoskeletal: Negative for falls.  Neurological: Negative for dizziness and loss of consciousness.  Endo/Heme/Allergies: Negative for polydipsia.    EKGs/Labs/Other Studies Reviewed:    The following studies were reviewed today: Exercise Stress 2018: ECG Baseline ECG exhibits normal sinus rhythm..  Stress Findings The patient exercised following the Bruce protocol.   The patient reported dizziness, shortness of breath and headache during the stress test.   The test was stopped because  the patient complained of fatigue and atypical chest pain.   Blood pressure demonstrated a hypertensive response to exercise. Overall, the patient's exercise capacity was normal.   85% of maximum heart rate was achieved after 6.5 minutes.  Recovery time:  9 minutes.  Response to Stress There was no ST segment deviation noted during stress.  Arrhythmias during stress:  none.   Arrhythmias during recovery:  none.     There were no significant arrhythmias noted during the test.   ECG was interpretable and conclusive.     TTE 2016: -------------------------------------------------------------------  Left ventricle: The cavity size was normal. Wall thickness was  increased in a pattern of mild LVH. Systolic function was normal.  The estimated ejection fraction was in the range of 60% to 65%.    -------------------------------------------------------------------  Aortic valve:  Structurally normal valve.  Cusp separation was  normal. Doppler: Transvalvular velocity was within the normal  range. There was no stenosis. There was no regurgitation.   -------------------------------------------------------------------  Mitral valve:  Mildly thickened leaflets . Doppler: There was  trivial regurgitation.   -------------------------------------------------------------------  Left atrium: The atrium was normal in size.   -------------------------------------------------------------------  Right ventricle: The cavity size was normal. Wall thickness was  normal. Systolic function was normal.   -------------------------------------------------------------------  Tricuspid valve:  Doppler: There was mild regurgitation.   -------------------------------------------------------------------  Right atrium: The atrium was normal in size.   -------------------------------------------------------------------  Pericardium: There was no pericardial effusion.   -------------------------------------------------------------------  Systemic veins:  Inferior vena cava: The vessel was normal in size. The  respirophasic diameter changes were in the normal range (= 50%),  consistent with normal central venous pressure. Diameter: 20 mm.  EKG:  EKG 02/09/21: NSR  with LAE  Recent Labs: 04/03/2020: ALT 18; BUN 13; Creatinine, Ser 0.99; Hemoglobin 13.2; Platelets 279; Potassium 3.7; Sodium 139 04/04/2020: B Natriuretic Peptide 29.8  Recent Lipid Panel No results found for: CHOL, TRIG, HDL, CHOLHDL, VLDL, LDLCALC, LDLDIRECT    Physical Exam:    VS:  BP 114/70   Pulse 76   Ht 6\' 1"  (1.854 m)   Wt 273 lb (123.8 kg)   SpO2 97%   BMI 36.02 kg/m     Wt Readings from Last 3 Encounters:  03/01/21 273 lb (123.8 kg)  08/27/20 273 lb (123.8 kg)  05/23/20 277 lb (125.6 kg)     GEN:   Well nourished, well developed in no acute distress HEENT: Normal NECK: No JVD; No carotid bruits CARDIAC: RRR, 2/6 systolic murmur. No rubs, gallops RESPIRATORY:  Clear to auscultation without rales, wheezing or rhonchi  ABDOMEN: Soft, non-tender, non-distended MUSCULOSKELETAL:  No edema; No deformity  SKIN: Warm and dry NEUROLOGIC:  Alert and oriented x 3 PSYCHIATRIC:  Normal affect   ASSESSMENT:    1. Murmur   2. Precordial pain   3. Hyperlipidemia, unspecified hyperlipidemia type   4. Primary hypertension    PLAN:    In order of problems listed above:  #Chest Pain: Patient presents with episodes of substernal chest throbbing that has been occurring intermittently. Not exertional related or positional. Each episode lasts a couple of minutes before abating. No known exacerbating or alleviating factors. Exercise stress 2018 negative. TTE with normal BiV function. Atypical in nature but given risk factors, will assess for underlying ischemia. -Check lexiscan  #Systolic Murmur: -Check TTE  #HTN: Well controlled. -Continue candesartan-HCTZ 32-25mg   -Continue metop 100mg  daily  #HLD: LDL 111. -Change simva to crestor    Shared Decision Making/Informed Consent The risks [chest pain, shortness of breath, cardiac arrhythmias, dizziness, blood pressure fluctuations, myocardial infarction, stroke/transient ischemic attack, nausea, vomiting, allergic reaction, radiation exposure, metallic taste sensation and life-threatening complications (estimated to be 1 in 10,000)], benefits (risk stratification, diagnosing coronary artery disease, treatment guidance) and alternatives of a nuclear stress test were discussed in detail with Mr. Mcqueary and he agrees to proceed.    Medication Adjustments/Labs and Tests Ordered: Current medicines are reviewed at length with the patient today.  Concerns regarding medicines are outlined above.  Orders Placed This Encounter  Procedures  . Lipid  Profile  . Cardiac Stress Test: Informed Consent Details: Physician/Practitioner Attestation; Transcribe to consent form and obtain patient signature  . MYOCARDIAL PERFUSION IMAGING  . ECHOCARDIOGRAM COMPLETE   Meds ordered this encounter  Medications  . rosuvastatin (CRESTOR) 10 MG tablet    Sig: Take 1 tablet (10 mg total) by mouth daily.    Dispense:  90 tablet    Refill:  1    Patient Instructions  Medication Instructions:   STOP TAKING SIMVASTATIN NOW  START TAKING ROSUVASTATIN 10 MG BY MOUTH DAILY  *If you need a refill on your cardiac medications before your next appointment, please call your pharmacy*   Lab Work:  IN 6 WEEKS HERE AT THE OFFICE--WILL CHECK LIPIDS--PLEASE COME  FASTING TO THIS LAB APPOINTMENT  If you have labs (blood work) drawn today and your tests are completely normal, you will receive your results only by: MyChart Message (if you have MyChart) OR . A paper copy in the mail If you have any lab test that is abnormal or we need to change your treatment, we will call you to review the results.   Testing/Procedures:  Your physician has requested that you have an echocardiogram. Echocardiography is a painless test that uses sound waves to create images of your heart. It provides your doctor with information about the size and shape of your heart and how well your heart's chambers and valves are working. This procedure takes approximately one hour. There are no restrictions for this procedure.  Your physician has requested that you have a lexiscan myoview. For further information please visit [email or web address]. Please follow instruction sheet, as given.    Follow-Up: At [facility], you and your health needs are our priority.  As part of our continuing mission to provide you with exceptional heart care, we have created designated Provider Care Teams.  These Care Teams include your primary Cardiologist (physician) and Advanced Practice Providers  (APPs -  Physician Assistants and Nurse Practitioners) who all work together to provide you with the care you need, when you need it.  We recommend signing up for the patient portal called "MyChart".  Sign up information is provided on this After Visit Summary.  MyChart is used to connect with patients for Virtual Visits (Telemedicine).  Patients are able to view lab/test results, encounter notes, upcoming appointments, etc.  Non-urgent messages can be sent to your provider as well.   To learn more about what you can do with MyChart, go to [email or web address].    Your next appointment:   6 month(s)  The format for your next appointment:   In Person  Provider:   You will see one of the following Advanced Practice Providers on your designated Care Team:    Tereso NewcomerScott Weaver, PA-C  Chelsea AusVin Bhagat, [place]        Signed, Meriam SpragueHeather E Pemberton, MD  03/01/2021 4:57 PM    [facility]

## 2021-03-01 ENCOUNTER — Other Ambulatory Visit: Payer: Self-pay

## 2021-03-01 ENCOUNTER — Encounter: Payer: Self-pay | Admitting: *Deleted

## 2021-03-01 ENCOUNTER — Ambulatory Visit: Payer: BC Managed Care – PPO | Admitting: Cardiology

## 2021-03-01 VITALS — BP 114/70 | HR 76 | Ht 73.0 in | Wt 273.0 lb

## 2021-03-01 DIAGNOSIS — I10 Essential (primary) hypertension: Secondary | ICD-10-CM | POA: Diagnosis not present

## 2021-03-01 DIAGNOSIS — R011 Cardiac murmur, unspecified: Secondary | ICD-10-CM

## 2021-03-01 DIAGNOSIS — E785 Hyperlipidemia, unspecified: Secondary | ICD-10-CM

## 2021-03-01 DIAGNOSIS — R072 Precordial pain: Secondary | ICD-10-CM

## 2021-03-01 MED ORDER — ROSUVASTATIN CALCIUM 10 MG PO TABS: 10.0000 mg | ORAL_TABLET | Freq: Every day | ORAL | 1 refills | Status: DC

## 2021-03-01 NOTE — Patient Instructions (Signed)
Medication Instructions:   STOP TAKING SIMVASTATIN NOW  START TAKING ROSUVASTATIN 10 MG BY MOUTH DAILY  *If you need a refill on your cardiac medications before your next appointment, please call your pharmacy*   Lab Work:  IN 6 WEEKS HERE AT THE OFFICE--WILL CHECK LIPIDS--PLEASE COME  FASTING TO THIS LAB APPOINTMENT  If you have labs (blood work) drawn today and your tests are completely normal, you will receive your results only by: Marland Kitchen MyChart Message (if you have MyChart) OR . A paper copy in the mail If you have any lab test that is abnormal or we need to change your treatment, we will call you to review the results.   Testing/Procedures:  Your physician has requested that you have an echocardiogram. Echocardiography is a painless test that uses sound waves to create images of your heart. It provides your doctor with information about the size and shape of your heart and how well your heart's chambers and valves are working. This procedure takes approximately one hour. There are no restrictions for this procedure.  Your physician has requested that you have a lexiscan myoview. For further information please visit [email or web address] Please follow instruction sheet, as given.    Follow-Up: At [facility], you and your health needs are our priority.  As part of our continuing mission to provide you with exceptional heart care, we have created designated Provider Care Teams.  These Care Teams include your primary Cardiologist (physician) and Advanced Practice Providers (APPs -  Physician Assistants and Nurse Practitioners) who all work together to provide you with the care you need, when you need it.  We recommend signing up for the patient portal called "MyChart".  Sign up information is provided on this After Visit Summary.  MyChart is used to connect with patients for Virtual Visits (Telemedicine).  Patients are able to view lab/test results, encounter notes, upcoming  appointments, etc.  Non-urgent messages can be sent to your provider as well.   To learn more about what you can do with MyChart, go to [email or web address].    Your next appointment:   6 month(s)  The format for your next appointment:   In Person  Provider:   You will see one of the following Advanced Practice Providers on your designated Care Team:    Tereso Newcomer, PA-C  [place], [place]

## 2021-03-02 ENCOUNTER — Telehealth (HOSPITAL_COMMUNITY): Payer: Self-pay | Admitting: *Deleted

## 2021-03-02 NOTE — Telephone Encounter (Signed)
Left message on voicemail per DPR in reference to upcoming appointment scheduled on 03/05/21 at 7:15 with detailed instructions given per Myocardial Perfusion Study Information Sheet for the test. LM to arrive 15 minutes early, and that it is imperative to arrive on time for appointment to keep from having the test rescheduled. If you need to cancel or reschedule your appointment, please call the office within 24 hours of your appointment. Failure to do so may result in a cancellation of your appointment, and a $50 no show fee. Phone number given for call back for any questions.

## 2021-03-03 ENCOUNTER — Encounter (HOSPITAL_COMMUNITY): Payer: Self-pay | Admitting: *Deleted

## 2021-03-05 ENCOUNTER — Ambulatory Visit (HOSPITAL_COMMUNITY): Payer: BC Managed Care – PPO | Attending: Cardiology

## 2021-03-05 ENCOUNTER — Other Ambulatory Visit: Payer: Self-pay

## 2021-03-05 ENCOUNTER — Encounter (INDEPENDENT_AMBULATORY_CARE_PROVIDER_SITE_OTHER): Payer: Self-pay

## 2021-03-05 ENCOUNTER — Ambulatory Visit (HOSPITAL_BASED_OUTPATIENT_CLINIC_OR_DEPARTMENT_OTHER): Payer: BC Managed Care – PPO

## 2021-03-05 DIAGNOSIS — R072 Precordial pain: Secondary | ICD-10-CM

## 2021-03-05 DIAGNOSIS — R011 Cardiac murmur, unspecified: Secondary | ICD-10-CM | POA: Diagnosis not present

## 2021-03-05 LAB — MYOCARDIAL PERFUSION IMAGING
LV dias vol: 107 mL (ref 62–150)
LV sys vol: 41 mL
Peak HR: 86 {beats}/min
Rest HR: 66 {beats}/min
SDS: 1
SRS: 0
SSS: 1
TID: 1

## 2021-03-05 LAB — ECHOCARDIOGRAM COMPLETE
Area-P 1/2: 3.57 cm2
S' Lateral: 2.8 cm

## 2021-03-05 MED ORDER — PERFLUTREN LIPID MICROSPHERE
1.0000 mL | INTRAVENOUS | Status: AC | PRN
  Administered 2021-03-05: 2 mL via INTRAVENOUS

## 2021-03-05 MED ORDER — REGADENOSON 0.4 MG/5ML IV SOLN
0.4000 mg | Freq: Once | INTRAVENOUS | Status: AC
  Administered 2021-03-05: 0.4 mg via INTRAVENOUS

## 2021-03-05 MED ORDER — TECHNETIUM TC 99M TETROFOSMIN IV KIT
10.7000 | PACK | Freq: Once | INTRAVENOUS | Status: AC | PRN
  Administered 2021-03-05: 10.7 via INTRAVENOUS
  Filled 2021-03-05: qty 11

## 2021-03-05 MED ORDER — TECHNETIUM TC 99M TETROFOSMIN IV KIT
32.7000 | PACK | Freq: Once | INTRAVENOUS | Status: AC | PRN
  Administered 2021-03-05: 32.7 via INTRAVENOUS
  Filled 2021-03-05: qty 33

## 2021-04-12 ENCOUNTER — Other Ambulatory Visit: Payer: BC Managed Care – PPO

## 2021-04-13 DIAGNOSIS — K219 Gastro-esophageal reflux disease without esophagitis: Secondary | ICD-10-CM | POA: Diagnosis not present

## 2021-04-13 DIAGNOSIS — R609 Edema, unspecified: Secondary | ICD-10-CM | POA: Diagnosis not present

## 2021-04-13 DIAGNOSIS — E785 Hyperlipidemia, unspecified: Secondary | ICD-10-CM | POA: Diagnosis not present

## 2021-04-13 DIAGNOSIS — I10 Essential (primary) hypertension: Secondary | ICD-10-CM | POA: Diagnosis not present

## 2021-06-20 ENCOUNTER — Other Ambulatory Visit: Payer: Self-pay | Admitting: Neurology

## 2021-06-23 DIAGNOSIS — Z79899 Other long term (current) drug therapy: Secondary | ICD-10-CM | POA: Diagnosis not present

## 2021-06-23 DIAGNOSIS — Z6836 Body mass index (BMI) 36.0-36.9, adult: Secondary | ICD-10-CM | POA: Diagnosis not present

## 2021-06-23 DIAGNOSIS — M25552 Pain in left hip: Secondary | ICD-10-CM | POA: Diagnosis not present

## 2021-06-24 DIAGNOSIS — M25552 Pain in left hip: Secondary | ICD-10-CM | POA: Diagnosis not present

## 2021-07-08 DIAGNOSIS — M1612 Unilateral primary osteoarthritis, left hip: Secondary | ICD-10-CM | POA: Diagnosis not present

## 2021-07-23 DIAGNOSIS — M1612 Unilateral primary osteoarthritis, left hip: Secondary | ICD-10-CM | POA: Diagnosis not present

## 2021-08-03 ENCOUNTER — Other Ambulatory Visit: Payer: Self-pay | Admitting: Neurology

## 2021-08-05 DIAGNOSIS — M1612 Unilateral primary osteoarthritis, left hip: Secondary | ICD-10-CM | POA: Diagnosis not present

## 2021-08-18 ENCOUNTER — Other Ambulatory Visit: Payer: Self-pay

## 2021-08-18 ENCOUNTER — Ambulatory Visit
Admission: EM | Admit: 2021-08-18 | Discharge: 2021-08-18 | Disposition: A | Discharge status: Home / Self Care | Payer: BC Managed Care – PPO | Attending: Internal Medicine | Admitting: Internal Medicine

## 2021-08-18 ENCOUNTER — Encounter: Payer: Self-pay | Admitting: Emergency Medicine

## 2021-08-18 DIAGNOSIS — B029 Zoster without complications: Secondary | ICD-10-CM | POA: Diagnosis not present

## 2021-08-18 MED ORDER — VALACYCLOVIR HCL 1 G PO TABS: 1000.0000 mg | ORAL_TABLET | Freq: Three times a day (TID) | ORAL | 0 refills | Status: AC

## 2021-08-18 NOTE — Discharge Instructions (Addendum)
Please stay at home until lesions are scabbed over and completely healed.  You have been prescribed valacyclovir which is an antiviral medication to treat shingles.  Please avoid scratching and keep lesions covered.  Follow-up if there are signs of infection that include increased redness, swelling, fever, pus.

## 2021-08-18 NOTE — ED Provider Notes (Signed)
[name]    CSN: [id number] Arrival date & time: 08/18/21  1707      History   Chief Complaint Chief Complaint  Patient presents with   Rash    HPI TOBEN ACUNA is a 65 y.o. male.   Patient presents with itchy, red rash that is present to left side that started approximately 3 days ago.  Denies any pain to rash.  Denies any changes to lotions, soaps, detergents, foods, etc.  Has any fevers.   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 Authorizing Provider  valACYclovir (VALTREX) 1000 MG tablet Take 1 tablet (1,000 mg total) by mouth 3 (three) times daily for 7 days. 08/18/21 08/25/21 Yes Lance Muss, FNP  Candesartan Cilexetil-HCTZ 32-25 MG TABS Take 1 tablet by mouth daily. 12/10/18   [provider]  celecoxib (CELEBREX) 200 MG capsule Take 200 mg by mouth 2 (two) times daily as needed.    [provider]  cetirizine (ZYRTEC) 10 MG tablet Take 10 mg by mouth daily. 01/11/21   [provider]  gabapentin (NEURONTIN) 300 MG capsule Take 1-2 capsules  (300-600 mg total) by mouth 3 (three) times daily. 11/03/20   Drema Dallas, DO  metoprolol succinate (TOPROL-XL) 100 MG 24 hr tablet Take 1 tablet (100 mg total) by mouth daily. Take with or immediately following a meal. 01/19/15   Gerhard Munch, MD  nortriptyline (PAMELOR) 50 MG capsule Take 1 capsule by mouth at bedtime 06/09/20   Drema Dallas, DO  omeprazole (PRILOSEC) 40 MG capsule Take 1 capsule by mouth daily. 12/30/18   [provider]  rosuvastatin (CRESTOR) 10 MG tablet Take 1 tablet (10 mg total) by mouth daily. 03/01/21   Meriam Sprague, MD  tamsulosin (FLOMAX) 0.4 MG CAPS capsule Take 0.4 mg by mouth daily after supper. 08/10/17   [provider]  tiZANidine (ZANAFLEX) 2 MG tablet TAKE 1 TABLET BY MOUTH THREE TIMES DAILY 08/04/21   Everlena Cooper, Adam R, DO  triamcinolone (KENALOG) 0.1 % Apply 1 application topically 2 (two) times daily. 02/07/21   Wieters, Hallie C, PA-C  furosemide (LASIX) 40 MG tablet Take 1 tablet (40 mg total) by mouth daily. 04/04/20 02/23/21  Dione Booze, MD    Family History Family History  Problem Relation Age of Onset   Lung disease Father    Asthma Mother    Healthy Sister    Healthy Brother    Healthy Child    Neuropathy Neg Hx  Social History Social History   Tobacco Use   Smoking status: Former    Packs/day: 1.50    Types: Cigarettes    Quit date: 10/30/2016    Years since quitting: 4.8   Smokeless tobacco: Never  Vaping Use   Vaping Use: Never used  Substance Use Topics   Alcohol use: Not Currently    Alcohol/week: 0.0 standard drinks    Comment: "not since last year" 06/29/2017". one beer on 4th of july 2020.   Drug use: No     Allergies   Patient has no known allergies.   Review of Systems Review of Systems  Per HPI Physical Exam Triage Vital Signs ED Triage Vitals  Enc Vitals Group     BP 08/18/21 1737 126/78     Pulse Rate 08/18/21 1737 73     Resp 08/18/21 1737 18     Temp 08/18/21 1737 98.2 F (36.8  C)     Temp Source 08/18/21 1737 Oral     SpO2 08/18/21 1737 97 %     Weight --      Height --      Head Circumference --      Peak Flow --      Pain Score 08/18/21 1738 0     Pain Loc --      Pain Edu? --      Excl. in GC? --    No data found.  Updated Vital Signs BP 126/78 (BP Location: Left Arm)   Pulse 73   Temp 98.2 F (36.8 C) (Oral)   Resp 18   SpO2 97%   Visual Acuity Right Eye Distance:   Left Eye Distance:   Bilateral Distance:    Right Eye Near:   Left Eye Near:    Bilateral Near:     Physical Exam Constitutional:      Appearance: Normal appearance.  HENT:     Head: Normocephalic and atraumatic.  Eyes:     Extraocular Movements: Extraocular movements intact.     Conjunctiva/sclera: Conjunctivae normal.  Pulmonary:     Effort: Pulmonary effort is normal.  Skin:    General: Skin is warm and dry.     Findings: Rash present. Rash is vesicular.     Comments: Red, grouped vesicular rash to left side that seems to follow a specific dermatome. One vesicular lesion is open.  Neurological:     General: No focal deficit present.     Mental Status: He is alert and oriented to person, place, and time. Mental status is at baseline.  Psychiatric:        Mood and Affect: Mood normal.        Behavior: Behavior normal.        Thought Content: Thought content normal.        Judgment: Judgment normal.     UC Treatments / Results  Labs (all labs ordered are listed, but only abnormal results are displayed) Labs Reviewed - No data to display  EKG   Radiology No results found.  Procedures Procedures (including critical care time)  Medications Ordered in UC Medications - No data to display  Initial Impression / Assessment and Plan / UC Course  I have reviewed the triage vital signs and the nursing notes.  Pertinent labs & imaging results that were available during my care of the patient were reviewed by me and considered in my medical decision making  (see chart for details).     Rash is most  consistent with herpes zoster.  Will treat with valacyclovir x7 days.  Patient does have one open lesion, so patient was advised that he is contagious at this time and to quarantine until the lesion scabs over and heals.  Patient voiced understanding.  No signs of bacterial infection at this time.  Advised patient to follow-up with PCP for further evaluation and management.Discussed strict return precautions. Patient verbalized understanding and is agreeable with plan.  Final Clinical Impressions(s) / UC Diagnoses   Final diagnoses:  Herpes zoster without complication     Discharge Instructions      Please stay at home until lesions are scabbed over and completely healed.  You have been prescribed valacyclovir which is an antiviral medication to treat shingles.  Please avoid scratching and keep lesions covered.  Follow-up if there are signs of infection that include increased redness, swelling, fever, pus.     ED Prescriptions     Medication Sig Dispense Auth. Provider   valACYclovir (VALTREX) 1000 MG tablet Take 1 tablet (1,000 mg total) by mouth 3 (three) times daily for 7 days. 21 tablet Lance Muss, FNP      PDMP not reviewed this encounter.   Lance Muss, FNP 08/18/21 1759

## 2021-08-18 NOTE — ED Triage Notes (Signed)
Pt here for itchy rash to left side with blisters noted; pt sts noted 3 days ago

## 2021-08-25 DIAGNOSIS — Z6836 Body mass index (BMI) 36.0-36.9, adult: Secondary | ICD-10-CM | POA: Diagnosis not present

## 2021-08-25 DIAGNOSIS — Z1331 Encounter for screening for depression: Secondary | ICD-10-CM | POA: Diagnosis not present

## 2021-08-25 DIAGNOSIS — Z8619 Personal history of other infectious and parasitic diseases: Secondary | ICD-10-CM | POA: Diagnosis not present

## 2021-08-27 ENCOUNTER — Ambulatory Visit: Payer: BC Managed Care – PPO | Admitting: Neurology

## 2021-09-08 ENCOUNTER — Other Ambulatory Visit: Payer: Self-pay | Admitting: Neurology

## 2021-10-13 DIAGNOSIS — E785 Hyperlipidemia, unspecified: Secondary | ICD-10-CM | POA: Diagnosis not present

## 2021-10-13 DIAGNOSIS — R609 Edema, unspecified: Secondary | ICD-10-CM | POA: Diagnosis not present

## 2021-10-13 DIAGNOSIS — K219 Gastro-esophageal reflux disease without esophagitis: Secondary | ICD-10-CM | POA: Diagnosis not present

## 2021-10-13 DIAGNOSIS — I10 Essential (primary) hypertension: Secondary | ICD-10-CM | POA: Diagnosis not present

## 2021-11-01 ENCOUNTER — Other Ambulatory Visit: Payer: Self-pay | Admitting: *Deleted

## 2021-11-01 DIAGNOSIS — R072 Precordial pain: Secondary | ICD-10-CM

## 2021-11-01 MED ORDER — ROSUVASTATIN CALCIUM 10 MG PO TABS: 10.0000 mg | ORAL_TABLET | Freq: Every day | ORAL | 0 refills | Status: DC

## 2021-11-30 ENCOUNTER — Other Ambulatory Visit: Payer: Self-pay | Admitting: *Deleted

## 2021-11-30 DIAGNOSIS — R072 Precordial pain: Secondary | ICD-10-CM

## 2021-11-30 MED ORDER — ROSUVASTATIN CALCIUM 10 MG PO TABS
10.0000 mg | ORAL_TABLET | Freq: Every day | ORAL | 0 refills | Status: DC
Start: 2021-11-30 — End: 2021-12-16

## 2021-12-16 ENCOUNTER — Other Ambulatory Visit: Payer: Self-pay | Admitting: *Deleted

## 2021-12-16 DIAGNOSIS — R072 Precordial pain: Secondary | ICD-10-CM

## 2021-12-16 MED ORDER — ROSUVASTATIN CALCIUM 10 MG PO TABS: 10.0000 mg | ORAL_TABLET | Freq: Every day | ORAL | 0 refills | Status: DC

## 2022-01-07 DIAGNOSIS — M1612 Unilateral primary osteoarthritis, left hip: Secondary | ICD-10-CM | POA: Diagnosis not present

## 2022-01-10 NOTE — Progress Notes (Deleted)
NEUROLOGY FOLLOW UP OFFICE NOTE  Michael Whitaker [id number]  Assessment/Plan:   Cervicogenic headache  Nortriptyline 50mg  QHS, gabapenitn 300mg -600mg  TID Follow up ***  Subjective:  Michael Whitaker is a 66 year old right-handed male whom I see for headache and neck pain who presents for headache and now right knee weakness.  Urgent Care note reviewed.   UPDATE: *** Current NSAIDS:  Celecoxib 200 mg twice daily as needed Current analgesics: None Current triptans: None Current ergotamine: None Current anti-emetic: None Current muscle relaxants: Tizanidine 2 mg three times daily for neck pain Current anti-anxiolytic: None Current sleep aide: None Current Antihypertensive medications: Lisinopril, Toprol-XL Current Antidepressant medications: Nortriptyline 50 mg at bedtime (only once in awhile) Current Anticonvulsant medications: gabapentin 300mg -600mg  TID (not daily) Current anti-CGRP: None Current Vitamins/Herbal/Supplements: None Current Antihistamines/Decongestants: None Other therapy: None   HISTORY: Onset:  In 2017, he developed left sided radicular neck pain.  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  In July 2017, he underwent cervical laminectomy with posterior spinal fusion a C3-4, C4-5 and C5-6 due to neck and left sided radicular pain.  Following surgery, he has had neck and posterior head pain.  Cervical X-rays on 10/14/16 demonstrated adequate hardware without evidence of loosening. Location:  Occipital region, radiating into neck bilaterally Quality:  throbbing Initial Intensity:  3-4/10 Aura:  no Prodrome:  no Postdrome:  no Associated symptoms:  None.  No nausea, vomiting, photophobia, phonophobia or visual disturbance.  He has not had any new worse headache of his life, or waking up from  sleep Initial Duration:  Constantly Initial Frequency:  daily Initial Frequency of abortive medication: daily Aggravating factors:  Neck movement Relieving factors:  Tylenol or Advil Activity:  Functions. Limited neck movement due to tightness and pain.  Still has pain radiating down both shoulders.  No arm/hand weakness or numbness.   Past NSAIDS:  Advil Past analgesics:  Tramadol, Tylenol Past abortive triptans:  no Past muscle relaxants:  Yes (does not remember which one but it caused drowsiness) Past antiepileptic: Gabapentin (ineffective) Other past therapies:  no  PAST MEDICAL HISTORY: Past Medical History:  Diagnosis Date   Body mass index (BMI) 35.0-35.9, adult    Chest pain    Chronic constipation    GERD (gastroesophageal reflux disease)    Hyperlipemia    Hypertension    Osteoarthritis    Tobacco use disorder     MEDICATIONS: Current Outpatient Medications on File Prior to Visit  Medication Sig Dispense Refill   Candesartan Cilexetil-HCTZ 32-25 MG TABS Take 1 tablet by mouth daily.     celecoxib (CELEBREX) 200 MG capsule Take 200 mg by mouth 2 (two) times daily as needed.     cetirizine (ZYRTEC) 10 MG tablet Take 10 mg by mouth daily.     gabapentin (NEURONTIN) 300 MG capsule Take 1-2 capsules (300-600 mg total) by mouth 3 (three) times daily. 180 capsule 5   metoprolol succinate (TOPROL-XL) 100 MG 24 hr tablet Take 1 tablet (100 mg total) by mouth daily. Take with or immediately following a meal. 30 tablet 0   nortriptyline (PAMELOR) 50 MG capsule Take 1 capsule by mouth at bedtime 30 capsule 4   omeprazole (PRILOSEC) 40 MG capsule Take 1 capsule by mouth daily.     rosuvastatin (CRESTOR) 10 MG tablet Take 1 tablet (10 mg total) by  mouth daily. 15 tablet 0   tamsulosin (FLOMAX) 0.4 MG CAPS capsule Take 0.4 mg by mouth daily after supper.     tiZANidine (ZANAFLEX) 2 MG tablet TAKE 1 TABLET BY MOUTH THREE TIMES DAILY 90 tablet 0   triamcinolone (KENALOG) 0.1 %  Apply 1 application topically 2 (two) times daily. 30 g 0   [DISCONTINUED] furosemide (LASIX) 40 MG tablet Take 1 tablet (40 mg total) by mouth daily. 30 tablet 0   No current facility-administered medications on file prior to visit.    ALLERGIES: No Known Allergies  FAMILY HISTORY: Family History  Problem Relation Age of Onset   Lung disease Father    Asthma Mother    Healthy Sister    Healthy Brother    Healthy Child    Neuropathy Neg Hx       Objective:  *** General: No acute distress.  Patient appears ***-groomed.   Head:  Normocephalic/atraumatic Eyes:  Fundi examined but not visualized Neck: supple, no paraspinal tenderness, full range of motion Heart:  Regular rate and rhythm Lungs:  Clear to auscultation bilaterally Back: No paraspinal tenderness Neurological Exam: alert and oriented to person, place, and time.  Speech fluent and not dysarthric, language intact.  CN II-XII intact. Bulk and tone normal, muscle strength 5/5 throughout.  Sensation to light touch intact.  Deep tendon reflexes 2+ throughout, toes downgoing.  Finger to nose testing intact.  Gait normal, Romberg negative.   Metta Clines, DO  CC: ***

## 2022-01-11 ENCOUNTER — Ambulatory Visit: Payer: BC Managed Care – PPO | Admitting: Neurology

## 2022-01-31 DIAGNOSIS — M1612 Unilateral primary osteoarthritis, left hip: Secondary | ICD-10-CM | POA: Diagnosis not present

## 2022-01-31 DIAGNOSIS — I10 Essential (primary) hypertension: Secondary | ICD-10-CM | POA: Diagnosis not present

## 2022-01-31 DIAGNOSIS — Z01818 Encounter for other preprocedural examination: Secondary | ICD-10-CM | POA: Diagnosis not present

## 2022-01-31 DIAGNOSIS — M79609 Pain in unspecified limb: Secondary | ICD-10-CM | POA: Diagnosis not present

## 2022-01-31 DIAGNOSIS — Z6835 Body mass index (BMI) 35.0-35.9, adult: Secondary | ICD-10-CM | POA: Diagnosis not present

## 2022-01-31 DIAGNOSIS — E559 Vitamin D deficiency, unspecified: Secondary | ICD-10-CM | POA: Diagnosis not present

## 2022-01-31 DIAGNOSIS — Z79899 Other long term (current) drug therapy: Secondary | ICD-10-CM | POA: Diagnosis not present

## 2022-02-22 DIAGNOSIS — M1612 Unilateral primary osteoarthritis, left hip: Secondary | ICD-10-CM | POA: Diagnosis not present

## 2022-02-24 ENCOUNTER — Other Ambulatory Visit: Payer: Self-pay | Admitting: Cardiology

## 2022-02-24 DIAGNOSIS — E785 Hyperlipidemia, unspecified: Secondary | ICD-10-CM

## 2022-02-24 DIAGNOSIS — R072 Precordial pain: Secondary | ICD-10-CM

## 2022-02-24 DIAGNOSIS — R011 Cardiac murmur, unspecified: Secondary | ICD-10-CM

## 2022-02-25 DIAGNOSIS — M1610 Unilateral primary osteoarthritis, unspecified hip: Secondary | ICD-10-CM | POA: Diagnosis not present

## 2022-02-28 DIAGNOSIS — Z9889 Other specified postprocedural states: Secondary | ICD-10-CM | POA: Diagnosis not present

## 2022-02-28 DIAGNOSIS — R2689 Other abnormalities of gait and mobility: Secondary | ICD-10-CM | POA: Diagnosis not present

## 2022-02-28 DIAGNOSIS — E669 Obesity, unspecified: Secondary | ICD-10-CM | POA: Diagnosis not present

## 2022-02-28 DIAGNOSIS — M1611 Unilateral primary osteoarthritis, right hip: Secondary | ICD-10-CM | POA: Diagnosis not present

## 2022-02-28 DIAGNOSIS — Z6837 Body mass index (BMI) 37.0-37.9, adult: Secondary | ICD-10-CM | POA: Diagnosis not present

## 2022-02-28 DIAGNOSIS — Z87891 Personal history of nicotine dependence: Secondary | ICD-10-CM | POA: Diagnosis not present

## 2022-02-28 DIAGNOSIS — Z96642 Presence of left artificial hip joint: Secondary | ICD-10-CM | POA: Diagnosis not present

## 2022-02-28 DIAGNOSIS — M1610 Unilateral primary osteoarthritis, unspecified hip: Secondary | ICD-10-CM | POA: Diagnosis not present

## 2022-02-28 DIAGNOSIS — Z471 Aftercare following joint replacement surgery: Secondary | ICD-10-CM | POA: Diagnosis not present

## 2022-02-28 DIAGNOSIS — M1612 Unilateral primary osteoarthritis, left hip: Secondary | ICD-10-CM | POA: Diagnosis not present

## 2022-02-28 HISTORY — PX: REPLACEMENT TOTAL HIP W/  RESURFACING IMPLANTS: SUR1222

## 2022-03-01 DIAGNOSIS — R2689 Other abnormalities of gait and mobility: Secondary | ICD-10-CM | POA: Diagnosis not present

## 2022-03-01 DIAGNOSIS — Z6837 Body mass index (BMI) 37.0-37.9, adult: Secondary | ICD-10-CM | POA: Diagnosis not present

## 2022-03-01 DIAGNOSIS — E669 Obesity, unspecified: Secondary | ICD-10-CM | POA: Diagnosis not present

## 2022-03-01 DIAGNOSIS — M1611 Unilateral primary osteoarthritis, right hip: Secondary | ICD-10-CM | POA: Diagnosis not present

## 2022-03-01 DIAGNOSIS — Z87891 Personal history of nicotine dependence: Secondary | ICD-10-CM | POA: Diagnosis not present

## 2022-03-01 DIAGNOSIS — M1610 Unilateral primary osteoarthritis, unspecified hip: Secondary | ICD-10-CM | POA: Diagnosis not present

## 2022-03-31 DIAGNOSIS — M79642 Pain in left hand: Secondary | ICD-10-CM | POA: Diagnosis not present

## 2022-03-31 DIAGNOSIS — Z6834 Body mass index (BMI) 34.0-34.9, adult: Secondary | ICD-10-CM | POA: Diagnosis not present

## 2022-03-31 DIAGNOSIS — M79602 Pain in left arm: Secondary | ICD-10-CM | POA: Diagnosis not present

## 2022-03-31 DIAGNOSIS — M25532 Pain in left wrist: Secondary | ICD-10-CM | POA: Diagnosis not present

## 2022-03-31 DIAGNOSIS — M19042 Primary osteoarthritis, left hand: Secondary | ICD-10-CM | POA: Diagnosis not present

## 2022-03-31 DIAGNOSIS — R200 Anesthesia of skin: Secondary | ICD-10-CM | POA: Diagnosis not present

## 2022-04-13 DIAGNOSIS — Z6835 Body mass index (BMI) 35.0-35.9, adult: Secondary | ICD-10-CM | POA: Diagnosis not present

## 2022-04-13 DIAGNOSIS — H6123 Impacted cerumen, bilateral: Secondary | ICD-10-CM | POA: Diagnosis not present

## 2022-04-18 NOTE — Progress Notes (Signed)
? ?NEUROLOGY FOLLOW UP OFFICE NOTE ? ?Michael Whitaker ?[id number] ? ?Assessment/Plan:  ? ?1.  Cervicogenic headache ?2.  Medication overuse headache ?3.  Tinnitus in left ear ? ?Restart nortriptyline 25mg  at bedtime titrating to 50mg  at bedtime ?Restart tizanidine 2mg  to take as needed for neck pain and headache ?Limit use of pain relievers (such as Tylenol) to no more than 2 days out of week to prevent risk of rebound or medication-overuse headache. ?Keep headache diary ?Refer to ENT regarding tinnitus ?Follow up 4-5 months. ? ?Subjective:  ?Michael Whitaker is a 66 year old right-handed male whom I see for headache and neck pain who follows up for headache. ?  ?UPDATE: ?Last seen in September 2021. ?Off gabapentin and nortriptyline.  He was doing well.  Had recent left hip surgery and right shoulder pain.  Started taking frequent Tylenol.  Headaches started increasing.  It occurs daily but lasts 30-45 minutes. ?  ?For the past several weeks, he hears a hissing sound in his left ear.  No ear pain, aural fullness or hearing loss.  When he would pull or push on either ear, it seemed to stop.  He did have cerumen which his PCP cleaned out but the sound continued.   ?  ?Current NSAIDS:  Celecoxib 200 mg twice daily as needed ?Current analgesics: None ?Current triptans: None ?Current ergotamine: None ?Current anti-emetic: None ?Current muscle relaxants: Tizanidine 2 mg three times daily for neck pain ?Current anti-anxiolytic: None ?Current sleep aide: None ?Current Antihypertensive medications: Lisinopril, Toprol-XL ?Current Antidepressant medications: Nortriptyline 50 mg at bedtime (only once in awhile) ?Current Anticonvulsant medications: gabapentin 300mg -600mg  TID (not daily) ?Current anti-CGRP: None ?Current Vitamins/Herbal/Supplements: None ?Current Antihistamines/Decongestants: None ?Other therapy: None ?  ?HISTORY: ?Onset:  In 2017, he developed left sided radicular neck pain.  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  In July 2017, he underwent cervical laminectomy with posterior spinal fusion a C3-4, C4-5 and C5-6 due to neck and left sided radicular pain.  Following surgery, he has had neck and posterior head pain.  Cervical X-rays on 10/14/16 demonstrated adequate hardware without evidence of loosening. ?Location:  Occipital region, radiating into neck bilaterally ?Quality:  throbbing ?Initial Intensity:  3-4/10 ?Aura:  no ?Prodrome:  no ?Postdrome:  no ?Associated symptoms:  None.  No nausea, vomiting, photophobia, phonophobia or visual disturbance.  He has not had any new worse headache of his life, or waking up from sleep ?Initial Duration:  Constantly ?Initial Frequency:  daily ?Initial Frequency of abortive medication: daily ?Aggravating factors:  Neck movement ?Relieving factors:  Tylenol or Advil ?Activity:  Functions. ?Limited neck movement due to tightness and pain.  Still has pain radiating down both shoulders.  No arm/hand weakness or numbness. ? ?He has history of left-sided sciatic pain.  MRI of lumbar spine from 06/27/2019 personally reviewed showed mild disc protrusion at L4-5 causing moderate left lateral recess stenosis possibly encroaching the left L5 nerve root.   ?  ?Past NSAIDS:  Advil ?Past analgesics:  Tramadol, Tylenol ?Past abortive triptans:  no ?Past muscle relaxants:  Yes (does not remember which one but it caused drowsiness) ?Past antiepileptic: Gabapentin (ineffective) ?Other past therapies:  no ? ?PAST MEDICAL HISTORY: ?Past Medical History:  ?Diagnosis Date  ? Body mass index (BMI) 35.0-35.9, adult   ? Chest pain   ? Chronic constipation   ?  GERD (gastroesophageal reflux disease)   ? Hyperlipemia   ? Hypertension   ? Osteoarthritis   ? Tobacco use disorder   ? ? ?MEDICATIONS: ?Current Outpatient Medications on File  Prior to Visit  ?Medication Sig Dispense Refill  ? Candesartan Cilexetil-HCTZ 32-25 MG TABS Take 1 tablet by mouth daily.    ? celecoxib (CELEBREX) 200 MG capsule Take 200 mg by mouth 2 (two) times daily as needed.    ? cetirizine (ZYRTEC) 10 MG tablet Take 10 mg by mouth daily.    ? gabapentin (NEURONTIN) 300 MG capsule Take 1-2 capsules (300-600 mg total) by mouth 3 (three) times daily. 180 capsule 5  ? metoprolol succinate (TOPROL-XL) 100 MG 24 hr tablet Take 1 tablet (100 mg total) by mouth daily. Take with or immediately following a meal. 30 tablet 0  ? nortriptyline (PAMELOR) 50 MG capsule Take 1 capsule by mouth at bedtime 30 capsule 4  ? omeprazole (PRILOSEC) 40 MG capsule Take 1 capsule by mouth daily.    ? rosuvastatin (CRESTOR) 10 MG tablet Take 1 tablet (10 mg total) by mouth daily. 15 tablet 0  ? tamsulosin (FLOMAX) 0.4 MG CAPS capsule Take 0.4 mg by mouth daily after supper.    ? tiZANidine (ZANAFLEX) 2 MG tablet TAKE 1 TABLET BY MOUTH THREE TIMES DAILY 90 tablet 0  ? triamcinolone (KENALOG) 0.1 % Apply 1 application topically 2 (two) times daily. 30 g 0  ? [DISCONTINUED] furosemide (LASIX) 40 MG tablet Take 1 tablet (40 mg total) by mouth daily. 30 tablet 0  ? ?No current facility-administered medications on file prior to visit.  ? ? ?ALLERGIES: ?No Known Allergies ? ?FAMILY HISTORY: ?Family History  ?Problem Relation Age of Onset  ? Lung disease Father   ? Asthma Mother   ? Healthy Sister   ? Healthy Brother   ? Healthy Child   ? Neuropathy Neg Hx   ? ? ?  ?Objective:  ?Blood pressure 122/74, pulse 78, height 6\' 2"  (1.88 m), weight 269 lb 9.6 oz (122.3 kg), SpO2 100 %. ?General: No acute distress.  Patient appears well-groomed.   ?Head:  Normocephalic/atraumatic ?Eyes:  Fundi examined but not visualized ?Neck: supple, spasm, full range of motion ?Heart:  Regular rate and rhythm ?Ear:  left TM clear ?Neurological Exam: alert and oriented to person, place, and time.  Speech fluent and not dysarthric,  language intact.  CN II-XII intact. Bulk and tone normal, muscle strength 5/5 throughout.  Sensation to light touch intact.  Deep tendon reflexes 2+ throughout, toes downgoing.  Finger to nose testing intact.  Gait normal, Romberg negative. ? ? ? , DO ? ?CC: Shon Millet, MD ? ? ? ? ? ? ?

## 2022-04-19 ENCOUNTER — Encounter: Payer: Self-pay | Admitting: Neurology

## 2022-04-19 ENCOUNTER — Ambulatory Visit: Payer: BC Managed Care – PPO | Admitting: Neurology

## 2022-04-19 VITALS — BP 122/74 | HR 78 | Ht 74.0 in | Wt 269.6 lb

## 2022-04-19 DIAGNOSIS — G4440 Drug-induced headache, not elsewhere classified, not intractable: Secondary | ICD-10-CM

## 2022-04-19 DIAGNOSIS — G4486 Cervicogenic headache: Secondary | ICD-10-CM | POA: Diagnosis not present

## 2022-04-19 DIAGNOSIS — H9312 Tinnitus, left ear: Secondary | ICD-10-CM

## 2022-04-19 MED ORDER — TIZANIDINE HCL 2 MG PO TABS: ORAL_TABLET | ORAL | 5 refills | Status: DC

## 2022-04-19 MED ORDER — NORTRIPTYLINE HCL 25 MG PO CAPS: ORAL_CAPSULE | ORAL | 0 refills | Status: DC

## 2022-04-19 NOTE — Patient Instructions (Signed)
Start nortriptyline as directed ?Take tizanidine as directed when you get a headache or have increased neck pain. ?Limit use of pain relievers (like Tylenol) to no more than 2 days out of week ?Refer to ENT  ?Follow up 4-5 months. ?

## 2022-04-26 DIAGNOSIS — M1612 Unilateral primary osteoarthritis, left hip: Secondary | ICD-10-CM | POA: Diagnosis not present

## 2022-04-26 DIAGNOSIS — M1610 Unilateral primary osteoarthritis, unspecified hip: Secondary | ICD-10-CM | POA: Diagnosis not present

## 2022-04-26 DIAGNOSIS — Z96642 Presence of left artificial hip joint: Secondary | ICD-10-CM | POA: Diagnosis not present

## 2022-04-29 ENCOUNTER — Encounter: Payer: Self-pay | Admitting: Emergency Medicine

## 2022-04-29 ENCOUNTER — Ambulatory Visit
Admission: EM | Admit: 2022-04-29 | Discharge: 2022-04-29 | Disposition: A | Discharge status: Home / Self Care | Payer: BC Managed Care – PPO | Attending: Physician Assistant | Admitting: Physician Assistant

## 2022-04-29 DIAGNOSIS — M109 Gout, unspecified: Secondary | ICD-10-CM | POA: Diagnosis not present

## 2022-04-29 HISTORY — DX: Gout, unspecified: M10.9

## 2022-04-29 MED ORDER — COLCHICINE 0.6 MG PO TABS: 0.6000 mg | ORAL_TABLET | Freq: Two times a day (BID) | ORAL | 0 refills | Status: DC

## 2022-04-29 MED ORDER — DEXAMETHASONE SODIUM PHOSPHATE 10 MG/ML IJ SOLN
10.0000 mg | Freq: Once | INTRAMUSCULAR | Status: AC
  Administered 2022-04-29: 10 mg via INTRAMUSCULAR

## 2022-04-29 NOTE — ED Triage Notes (Signed)
Patient c/o LFT greater toe pain x 4 days.   Patient denies fall or trauma.   Patient endorses swelling and redness.   History of Gout.   Patient c/o LFT ear abnormal sounds x 3 weeks .   Patient endorses having ear flushed 3 weeks ago.   Patient denies pain.

## 2022-04-30 NOTE — ED Provider Notes (Signed)
[name]    CSN: [id number] Arrival date & time: 04/29/22  1457      History   Chief Complaint Chief Complaint  Patient presents with   Toe Pain   Abnormal Ear Sounds     HPI Michael Whitaker is a 66 y.o. male.   Pt complains of gout in the toe of his left foot.  Pt reports he has also had a whooshing sound in his left ear.  Pt has had gout in the past.  Pt reports he usually gets a shot of prednisone when it gets this bad.    The history is provided by the patient. No language interpreter was used.  Toe Pain This is a new problem. The problem occurs constantly. The problem has been gradually worsening. Nothing aggravates the symptoms. Nothing relieves the symptoms. He has tried nothing for the symptoms.   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 Authorizing Provider  Candesartan Cilexetil-HCTZ 32-25 MG TABS Take 1 tablet by mouth daily. 12/10/18  Yes [provider]  colchicine 0.6 MG tablet Take 1 tablet  (0.6 mg total) by mouth 2 (two) times daily. 04/29/22 04/29/23 Yes Elson Areas, PA-C  metoprolol succinate (TOPROL-XL) 100 MG 24 hr tablet Take 1 tablet (100 mg total) by mouth daily. Take with or immediately following a meal. 01/19/15  Yes Gerhard Munch, MD  nortriptyline (PAMELOR) 25 MG capsule Take 1 capsule at bedtime for one week, then increase to 2 capsules at bedtime 04/19/22  Yes Jaffe, Adam R, DO  omeprazole (PRILOSEC) 40 MG capsule Take 1 capsule by mouth daily. 12/30/18  Yes [provider]  simvastatin (ZOCOR) 10 MG tablet Take 10 mg by mouth daily. 04/10/22  Yes [provider]  tiZANidine (ZANAFLEX) 2 MG tablet Take 1 tablet three times daily as needed. 04/19/22  Yes Jaffe, Adam R, DO  furosemide (LASIX) 40 MG tablet Take 1 tablet (40 mg total) by mouth daily. 04/04/20 02/23/21  Dione Booze, MD    Family History Family History  Problem Relation Age of Onset   Lung disease Father    Asthma Mother    Healthy Sister    Healthy Brother    Healthy Child    Neuropathy Neg Hx     Social History Social History   Tobacco Use   Smoking status: Former    Packs/day: 1.50    Types: Cigarettes  Quit date: 10/30/2016    Years since quitting: 5.5   Smokeless tobacco: Never  Vaping Use   Vaping Use: Never used  Substance Use Topics   Alcohol use: Not Currently    Alcohol/week: 0.0 standard drinks    Comment: "not since last year" 06/29/2017". one beer on 4th of july 2020.   Drug use: No     Allergies   Patient has no known allergies.   Review of Systems Review of Systems  HENT:  Negative for ear discharge, ear pain and hearing loss.   Musculoskeletal:  Positive for joint swelling.  All other systems reviewed and are negative.   Physical Exam Triage Vital Signs ED Triage Vitals  Enc Vitals Group     BP 04/29/22 1538 122/78     Pulse Rate 04/29/22 1538 79     Resp 04/29/22 1538 16     Temp 04/29/22 1538 (!) 97.4 F (36.3 C)     Temp Source  04/29/22 1538 Oral     SpO2 04/29/22 1538 98 %     Weight --      Height --      Head Circumference --      Peak Flow --      Pain Score 04/29/22 1543 7     Pain Loc --      Pain Edu? --      Excl. in GC? --    No data found.  Updated Vital Signs BP 122/78 (BP Location: Right Arm)   Pulse 79   Temp (!) 97.4 F (36.3 C) (Oral)   Resp 16   SpO2 98%   Visual Acuity Right Eye Distance:   Left Eye Distance:   Bilateral Distance:    Right Eye Near:   Left Eye Near:    Bilateral Near:     Physical Exam Vitals and nursing note reviewed.  Constitutional:      General: He is not in acute distress.    Appearance: He is well-developed.  HENT:     Head: Normocephalic and atraumatic.     Right Ear: Tympanic membrane normal.     Left Ear: Tympanic membrane normal.  Eyes:     Conjunctiva/sclera: Conjunctivae normal.  Cardiovascular:     Rate and Rhythm: Normal rate and regular rhythm.     Heart sounds: No murmur heard. Pulmonary:     Effort: Pulmonary effort is normal. No respiratory distress.     Breath sounds: Normal breath sounds.  Abdominal:     Tenderness: There is no abdominal tenderness.  Musculoskeletal:        General: Swelling present.     Cervical back: Neck supple.     Comments: Swollen left 1st toe at MTP  Skin:    General: Skin is warm and dry.     Capillary Refill: Capillary refill takes less than 2 seconds.  Neurological:     Mental Status: He is alert.  Psychiatric:        Mood and Affect: Mood normal.     UC Treatments / Results  Labs (all labs ordered are listed, but only abnormal results are displayed) Labs Reviewed - No data to display  EKG   Radiology No results found.  Procedures Procedures (including critical care time)  Medications Ordered in UC Medications  dexamethasone (DECADRON) injection 10 mg (10 mg Intramuscular Given 04/29/22 1647)    Initial Impression / Assessment and Plan / UC Course  I have reviewed the triage  vital signs  and the nursing notes.  Pertinent labs & imaging results that were available during my care of the patient were reviewed by me and considered in my medical decision making (see chart for details).     No sign of ear infection.  Pt has gout in left foot.  Pt given an injection of decadron and rx for colchicine.  Final Clinical Impressions(s) / UC Diagnoses   Final diagnoses:  Acute gout of left foot, unspecified cause   Discharge Instructions   None    ED Prescriptions     Medication Sig Dispense Auth. Provider   colchicine 0.6 MG tablet Take 1 tablet (0.6 mg total) by mouth 2 (two) times daily. 20 tablet Elson Areas, [place]      PDMP not reviewed this encounter. An After Visit Summary was printed and given to the patient.    Elson Areas, [place] 04/30/22 1846

## 2022-05-03 DIAGNOSIS — Z6834 Body mass index (BMI) 34.0-34.9, adult: Secondary | ICD-10-CM | POA: Diagnosis not present

## 2022-05-03 DIAGNOSIS — M109 Gout, unspecified: Secondary | ICD-10-CM | POA: Diagnosis not present

## 2022-05-21 ENCOUNTER — Ambulatory Visit
Admission: EM | Admit: 2022-05-21 | Discharge: 2022-05-21 | Disposition: A | Discharge status: Home / Self Care | Payer: BC Managed Care – PPO | Attending: Internal Medicine | Admitting: Internal Medicine

## 2022-05-21 DIAGNOSIS — M109 Gout, unspecified: Secondary | ICD-10-CM

## 2022-05-21 MED ORDER — PREDNISONE 20 MG PO TABS: 20.0000 mg | ORAL_TABLET | Freq: Every day | ORAL | 0 refills | Status: AC

## 2022-05-21 NOTE — ED Provider Notes (Signed)
[name]    CSN: [id number] Arrival date & time: 05/21/22  0807      History   Chief Complaint Chief Complaint  Patient presents with   Foot Pain    HPI Michael Whitaker is a 66 y.o. male.   Patient presents with swelling and pain to right great toe joint that started about a week ago.  Patient reports that he was seen a few weeks prior for the toe on the left foot that has now resolved.  He has taken Tylenol for symptoms with minimal improvement.  Patient does report that he has been eating a lot of fish lately so is not sure if symptoms are attributed to this.  Denies any apparent injury to the area.  Denies numbness or tingling.   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 Authorizing Provider  predniSONE (DELTASONE) 20 MG tablet Take 1 tablet (20 mg total) by mouth daily for 5 days. 05/21/22 05/26/22 Yes Seline Enzor, Acie Fredrickson, FNP  Candesartan Cilexetil-HCTZ 32-25 MG TABS Take 1 tablet by mouth daily.  12/10/18   [provider]  colchicine 0.6 MG tablet Take 1 tablet (0.6 mg total) by mouth 2 (two) times daily. 04/29/22 04/29/23  Elson Areas, PA-C  metoprolol succinate (TOPROL-XL) 100 MG 24 hr tablet Take 1 tablet (100 mg total) by mouth daily. Take with or immediately following a meal. 01/19/15   Gerhard Munch, MD  nortriptyline (PAMELOR) 25 MG capsule Take 1 capsule at bedtime for one week, then increase to 2 capsules at bedtime 04/19/22   Drema Dallas, DO  omeprazole (PRILOSEC) 40 MG capsule Take 1 capsule by mouth daily. 12/30/18   [provider]  simvastatin (ZOCOR) 10 MG tablet Take 10 mg by mouth daily. 04/10/22   [provider]  tiZANidine (ZANAFLEX) 2 MG tablet Take 1 tablet three times daily as needed. 04/19/22   Drema Dallas, DO  furosemide (LASIX) 40 MG tablet Take 1 tablet (40 mg total) by mouth daily. 04/04/20 02/23/21  Dione Booze, MD    Family History Family History  Problem Relation Age of Onset   Lung disease Father    Asthma Mother    Healthy Sister    Healthy Brother    Healthy Child    Neuropathy Neg Hx     Social History Social History   Tobacco Use   Smoking  status: Former    Packs/day: 1.50    Types: Cigarettes    Quit date: 10/30/2016    Years since quitting: 5.5   Smokeless tobacco: Never  Vaping Use   Vaping Use: Never used  Substance Use Topics   Alcohol use: Not Currently    Alcohol/week: 0.0 standard drinks of alcohol    Comment: "not since last year" 06/29/2017". one beer on 4th of july 2020.   Drug use: No     Allergies   Patient has no known allergies.   Review of Systems Review of Systems Per HPI  Physical Exam Triage Vital Signs ED Triage Vitals  Enc Vitals Group     BP 05/21/22 0824 136/87     Pulse Rate 05/21/22 0824 75     Resp 05/21/22 0824 18     Temp 05/21/22 0824 98.1 F (36.7 C)     Temp Source 05/21/22 0824 Oral     SpO2 05/21/22 0824 98 %     Weight --      Height --      Head  Circumference --      Peak Flow --      Pain Score 05/21/22 0823 9     Pain Loc --      Pain Edu? --      Excl. in GC? --    No data found.  Updated Vital Signs BP 136/87 (BP Location: Left Arm)   Pulse 75   Temp 98.1 F (36.7 C) (Oral)   Resp 18   SpO2 98%   Visual Acuity Right Eye Distance:   Left Eye Distance:   Bilateral Distance:    Right Eye Near:   Left Eye Near:    Bilateral Near:     Physical Exam Constitutional:      General: He is not in acute distress.    Appearance: Normal appearance. He is not toxic-appearing or diaphoretic.  HENT:     Head: Normocephalic and atraumatic.  Eyes:     Extraocular Movements: Extraocular movements intact.     Conjunctiva/sclera: Conjunctivae normal.  Pulmonary:     Effort: Pulmonary effort is normal.  Musculoskeletal:     Comments: Redness, warmth, erythema located to right great toe joint at metatarsal phalangeal joint.  Capillary refill and pulses normal.  Patient has full range of motion of joint.  Tenderness to palpation to area.  Neurological:     General: No focal deficit present.     Mental Status: He is alert and oriented to person, place, and time. Mental status is at baseline.  Psychiatric:        Mood and Affect: Mood normal.        Behavior: Behavior normal.        Thought Content: Thought content normal.        Judgment: Judgment normal.      UC Treatments / Results  Labs (all labs ordered are listed, but only abnormal results are displayed) Labs Reviewed - No data to display  EKG   Radiology No results found.  Procedures Procedures (including critical care time)  Medications Ordered in UC Medications - No data to display  Initial Impression / Assessment and Plan / UC Course  I have reviewed the triage vital signs and the nursing notes.  Pertinent labs & imaging results that were available during my care of the patient were reviewed by me and considered in my medical decision making (see  chart for details).  Patient has a history of gout.  Physical examination is consistent with gout flareup of right toe joint.  No concern for septic joint.  Will treat with prednisone as patient has recently been seen a few weeks prior for gout flareup and I do think that he would benefit from this.  Will treat with low-dose and short course given the patient takes HCTZ.  Discussed supportive care with patient.  Patient to follow-up with PCP for further evaluation and management given frequent flareups lately.  Patient was educated on on avoiding high purine foods as well.  Discussed return precautions.  Patient verbalized understanding and was agreeable with plan. Final Clinical Impressions(s) / UC Diagnoses   Final diagnoses:  Acute gout involving toe of right foot, unspecified cause     Discharge Instructions      It appears that you have gout so this is being treated with prednisone steroid pills.  Please follow-up if symptoms persist or worsen.  I also recommend that you follow-up with your primary care doctor for further evaluation and management given that this has been a frequent recurrent issue.    ED Prescriptions     Medication Sig Dispense Auth. Provider   predniSONE (DELTASONE) 20 MG tablet Take 1 tablet (20 mg total) by mouth daily for 5 days. 5 tablet [place], Acie Fredrickson, [place]      PDMP not reviewed this encounter.   Gustavus Bryant, [place] 05/21/22 [phone]

## 2022-05-21 NOTE — Discharge Instructions (Signed)
It appears that you have gout so this is being treated with prednisone steroid pills.  Please follow-up if symptoms persist or worsen.  I also recommend that you follow-up with your primary care doctor for further evaluation and management given that this has been a frequent recurrent issue.

## 2022-05-21 NOTE — ED Triage Notes (Signed)
Pt states his right ankle appears swollen. Pt states no reported injury. Pt states he took Tylenol this morning. Pt states he feels tightness and numbness on his right toe.

## 2022-05-26 DIAGNOSIS — M109 Gout, unspecified: Secondary | ICD-10-CM | POA: Diagnosis not present

## 2022-05-26 DIAGNOSIS — Z6834 Body mass index (BMI) 34.0-34.9, adult: Secondary | ICD-10-CM | POA: Diagnosis not present

## 2022-06-10 ENCOUNTER — Ambulatory Visit
Admission: EM | Admit: 2022-06-10 | Discharge: 2022-06-10 | Disposition: A | Discharge status: Home / Self Care | Payer: BC Managed Care – PPO | Attending: Internal Medicine | Admitting: Internal Medicine

## 2022-06-10 DIAGNOSIS — S93401A Sprain of unspecified ligament of right ankle, initial encounter: Secondary | ICD-10-CM | POA: Diagnosis not present

## 2022-06-10 MED ORDER — MELOXICAM 7.5 MG PO TABS: 7.5000 mg | ORAL_TABLET | Freq: Every day | ORAL | 0 refills | Status: DC

## 2022-06-10 NOTE — ED Triage Notes (Signed)
Pt c/o intermittent right ankle pain. States at some point was dx and treated for gout. Cont to have this pain.

## 2022-06-10 NOTE — Discharge Instructions (Addendum)
Apply a compressive ACE bandage.  Rest and elevate the affected painful area.   Apply cold compresses intermittently as needed.   As pain recedes, begin normal activities slowly as tolerated.   Return to urgent care if symptoms persist. 

## 2022-06-12 NOTE — ED Provider Notes (Signed)
[name]    CSN: [id number] Arrival date & time: 06/10/22  0919      History   Chief Complaint Chief Complaint  Patient presents with   Ankle Pain    HPI Michael Whitaker is a 66 y.o. male comes to urgent care with 2-week history of right ankle pain.  Pain is throbbing and at times sharp.  It is aggravated.  He has not tried any medications at this point.  Pain has been intermittent over the past 2 weeks.  He denies any cough swelling or chest pain.  No shortness of breath.  Patient has a history of gout but it is gout flares usually affects his right great toe.  No recent alcohol or red meat intake.  No changes in medications.  Patient denies fall or trauma to the right ankle.  Pain is nonradiating and is not associated with swelling of the ankle.  Comes to urgent care with.   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 Authorizing Provider  meloxicam (MOBIC) 7.5 MG tablet Take 1 tablet (7.5 mg total) by mouth  daily. 06/10/22  Yes Zayon Trulson, Britta Mccreedy, MD  Candesartan Cilexetil-HCTZ 32-25 MG TABS Take 1 tablet by mouth daily. 12/10/18   [provider]  colchicine 0.6 MG tablet Take 1 tablet (0.6 mg total) by mouth 2 (two) times daily. 04/29/22 04/29/23  Elson Areas, PA-C  metoprolol succinate (TOPROL-XL) 100 MG 24 hr tablet Take 1 tablet (100 mg total) by mouth daily. Take with or immediately following a meal. 01/19/15   Gerhard Munch, MD  nortriptyline (PAMELOR) 25 MG capsule Take 1 capsule at bedtime for one week, then increase to 2 capsules at bedtime 04/19/22   Drema Dallas, DO  omeprazole (PRILOSEC) 40 MG capsule Take 1 capsule by mouth daily. 12/30/18   [provider]  simvastatin (ZOCOR) 10 MG tablet Take 10 mg by mouth daily. 04/10/22   [provider]  tiZANidine (ZANAFLEX) 2 MG tablet Take 1 tablet three times daily as needed. 04/19/22   Drema Dallas, DO  furosemide (LASIX) 40 MG tablet Take 1 tablet (40 mg total) by mouth daily. 04/04/20 02/23/21  Dione Booze, MD    Family History Family History  Problem Relation Age of Onset   Lung disease Father    Asthma Mother    Healthy Sister    Healthy Brother  Healthy Child    Neuropathy Neg Hx     Social History Social History   Tobacco Use   Smoking status: Former    Packs/day: 1.50    Types: Cigarettes    Quit date: 10/30/2016    Years since quitting: 5.6   Smokeless tobacco: Never  Vaping Use   Vaping Use: Never used  Substance Use Topics   Alcohol use: Not Currently    Alcohol/week: 0.0 standard drinks of alcohol    Comment: "not since last year" 06/29/2017". one beer on 4th of july 2020.   Drug use: No     Allergies   Patient has no known allergies.   Review of Systems Review of Systems As per HPI  Physical Exam Triage Vital Signs ED Triage Vitals  Enc Vitals Group     BP 06/10/22 0928 120/70     Pulse Rate 06/10/22 0928 70     Resp 06/10/22 0928 18     Temp 06/10/22 0928 98 F (36.7  C)     Temp Source 06/10/22 0928 Oral     SpO2 06/10/22 0928 98 %     Weight --      Height --      Head Circumference --      Peak Flow --      Pain Score 06/10/22 0925 0     Pain Loc --      Pain Edu? --      Excl. in GC? --    No data found.  Updated Vital Signs BP 120/70 (BP Location: Left Arm)   Pulse 70   Temp 98 F (36.7 C) (Oral)   Resp 18   SpO2 98%   Visual Acuity Right Eye Distance:   Left Eye Distance:   Bilateral Distance:    Right Eye Near:   Left Eye Near:    Bilateral Near:     Physical Exam Vitals and nursing note reviewed.  Constitutional:      Appearance: Normal appearance. He is not ill-appearing.  Cardiovascular:     Rate and Rhythm: Normal rate and regular rhythm.  Abdominal:     General: Bowel sounds are normal.     Palpations: Abdomen is soft.  Musculoskeletal:        General: Tenderness present. No swelling, deformity or signs of injury. Normal range of motion.     Right lower leg: No edema.  Skin:    General: Skin is warm.     Findings: No bruising or erythema.  Neurological:     Mental Status: He is alert.      UC Treatments / Results  Labs (all labs ordered are listed, but only abnormal results are displayed) Labs Reviewed - No data to display  EKG   Radiology No results found.  Procedures Procedures (including critical care time)  Medications Ordered in UC Medications - No data to display  Initial Impression / Assessment and Plan / UC Course  I have reviewed the triage vital signs and the nursing notes.  Pertinent labs & imaging results that were available during my care of the patient were reviewed by me and considered in my medical decision making (see chart for details).     1.  Right ankle sprain: Meloxicam 7.5 mg orally daily Patient's symptoms are not consistent with acute gout flare Icing of the right ankle.  Range of motion exercises Elevation of the right lower extremity Return precautions  given. Patient has no calf pain, heart rate  is 70 and patient is not short of breath and his Wells score is low probability for pulmonary embolism. Final Clinical Impressions(s) / UC Diagnoses   Final diagnoses:  Sprain of right ankle, unspecified ligament, initial encounter     Discharge Instructions      Apply a compressive ACE bandage.  Rest and elevate the affected painful area.   Apply cold compresses intermittently as needed.   As pain recedes, begin normal activities slowly as tolerated.   Return to urgent care if symptoms persist.    ED Prescriptions     Medication Sig Dispense Auth. Provider   meloxicam (MOBIC) 7.5 MG tablet Take 1 tablet (7.5 mg total) by mouth daily. 30 tablet Marianna Cid, Britta Mccreedy, MD      PDMP not reviewed this encounter.   Merrilee Jansky, MD 06/12/22 [phone]

## 2022-07-04 DIAGNOSIS — H903 Sensorineural hearing loss, bilateral: Secondary | ICD-10-CM | POA: Diagnosis not present

## 2022-07-04 DIAGNOSIS — Z77122 Contact with and (suspected) exposure to noise: Secondary | ICD-10-CM | POA: Diagnosis not present

## 2022-07-04 DIAGNOSIS — H93A2 Pulsatile tinnitus, left ear: Secondary | ICD-10-CM | POA: Diagnosis not present

## 2022-07-04 DIAGNOSIS — H9312 Tinnitus, left ear: Secondary | ICD-10-CM | POA: Diagnosis not present

## 2022-08-22 NOTE — Progress Notes (Deleted)
NEUROLOGY FOLLOW UP OFFICE NOTE  [facility] [id number]  Assessment/Plan:   1.  Cervicogenic headache 2.  Medication overuse headache 3.  Left neurosensory hearing loss with tinnitus  Restart nortriptyline 25mg  at bedtime titrating to 50mg  at bedtime Restart tizanidine 2mg  to take as needed for neck pain and headache Limit use of pain relievers (such as Tylenol) to no more than 2 days out of week to prevent risk of rebound or medication-overuse headache. Keep headache diary Refer to ENT regarding tinnitus Follow up 4-5 months.  Subjective:  Michael Whitaker is a 66 year old right-handed male who follows up for headache.   UPDATE: For headaches, restarted nortriptyline in May.  ***   Referred to ENT for evaluation of left sided tinnitus.  Found to have a 15-20dB asymmetric sensorineural hearing loss in the left ear.  ***   Current NSAIDS:  Celecoxib 200 mg twice daily as needed Current analgesics: None Current triptans: None Current ergotamine: None Current anti-emetic: None Current muscle relaxants: Tizanidine 2 mg three times daily for neck pain Current anti-anxiolytic: None Current sleep aide: None Current Antihypertensive medications: Lisinopril, Toprol-XL Current Antidepressant medications: Nortriptyline 50 mg at bedtime Current Anticonvulsant medications: gabapentin 300mg -600mg  TID (not daily) Current anti-CGRP: None Current Vitamins/Herbal/Supplements: None Current Antihistamines/Decongestants: None Other therapy: None   HISTORY: Onset:  In 2017, he developed left sided radicular neck pain.  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  In July 2017, he underwent cervical laminectomy with posterior spinal fusion a C3-4, C4-5 and C5-6 due to neck and left sided radicular pain.  Following surgery,  he has had neck and posterior head pain.  Cervical X-rays on 10/14/16 demonstrated adequate hardware without evidence of loosening. Location:  Occipital region, radiating into neck bilaterally Quality:  throbbing Initial Intensity:  3-4/10 Aura:  no Prodrome:  no Postdrome:  no Associated symptoms:  None.  No nausea, vomiting, photophobia, phonophobia or visual disturbance.  He has not had any new worse headache of his life, or waking up from sleep Initial Duration:  Constantly Initial Frequency:  daily Initial Frequency of abortive medication: daily Aggravating factors:  Neck movement Relieving factors:  Tylenol or Advil Activity:  Functions. Limited neck movement due to tightness and pain.  Still has pain radiating down both shoulders.  No arm/hand weakness or numbness.  He has history of left-sided sciatic pain.  MRI of lumbar spine from 06/27/2019 personally reviewed showed mild disc protrusion at L4-5 causing moderate left lateral recess stenosis possibly encroaching the left L5 nerve root.     Past NSAIDS:  Advil Past analgesics:  Tramadol, Tylenol Past abortive triptans:  no Past muscle relaxants:  Yes (does not remember which one but it caused drowsiness) Past antiepileptic: Gabapentin (ineffective) Other past therapies:  no  PAST MEDICAL HISTORY: Past Medical History:  Diagnosis Date   Body mass index (BMI) 35.0-35.9, adult    Chest pain    Chronic constipation    GERD (gastroesophageal reflux disease)    Gout    Hyperlipemia    Hypertension    Osteoarthritis    Tobacco use disorder     MEDICATIONS: Current Outpatient Medications on File Prior to Visit  Medication Sig Dispense Refill   Candesartan Cilexetil-HCTZ 32-25 MG TABS Take 1 tablet by mouth daily.     colchicine 0.6 MG tablet Take 1 tablet (0.6  mg total) by mouth 2 (two) times daily. 20 tablet 0   meloxicam (MOBIC) 7.5 MG tablet Take 1 tablet (7.5 mg total) by mouth daily. 30 tablet 0   metoprolol  succinate (TOPROL-XL) 100 MG 24 hr tablet Take 1 tablet (100 mg total) by mouth daily. Take with or immediately following a meal. 30 tablet 0   nortriptyline (PAMELOR) 25 MG capsule Take 1 capsule at bedtime for one week, then increase to 2 capsules at bedtime 60 capsule 0   omeprazole (PRILOSEC) 40 MG capsule Take 1 capsule by mouth daily.     simvastatin (ZOCOR) 10 MG tablet Take 10 mg by mouth daily.     tiZANidine (ZANAFLEX) 2 MG tablet Take 1 tablet three times daily as needed. 90 tablet 5   [DISCONTINUED] furosemide (LASIX) 40 MG tablet Take 1 tablet (40 mg total) by mouth daily. 30 tablet 0   No current facility-administered medications on file prior to visit.    ALLERGIES: No Known Allergies  FAMILY HISTORY: Family History  Problem Relation Age of Onset   Lung disease Father    Asthma Mother    Healthy Sister    Healthy Brother    Healthy Child    Neuropathy Neg Hx       Objective:  *** General: No acute distress.  Patient appears well-groomed.   Head:  Normocephalic/atraumatic Eyes:  Fundi examined but not visualized Neck: supple, spasm, full range of motion Heart:  Regular rate and rhythm Ear:  left TM clear Neurological Exam: alert and oriented to person, place, and time.  Speech fluent and not dysarthric, language intact.  CN II-XII intact. Bulk and tone normal, muscle strength 5/5 throughout.  Sensation to light touch intact.  Deep tendon reflexes 2+ throughout, toes downgoing.  Finger to nose testing intact.  Gait normal, Romberg negative. ***   Metta Clines, DO  CC: Jenean Lindau, MD

## 2022-08-24 ENCOUNTER — Ambulatory Visit: Payer: BC Managed Care – PPO | Admitting: Neurology

## 2022-08-24 ENCOUNTER — Encounter: Payer: Self-pay | Admitting: Neurology

## 2022-08-24 VITALS — BP 118/79 | HR 81 | Ht 72.0 in | Wt 270.0 lb

## 2022-08-24 DIAGNOSIS — M542 Cervicalgia: Secondary | ICD-10-CM

## 2022-08-24 DIAGNOSIS — G4486 Cervicogenic headache: Secondary | ICD-10-CM | POA: Diagnosis not present

## 2022-08-24 DIAGNOSIS — G4440 Drug-induced headache, not elsewhere classified, not intractable: Secondary | ICD-10-CM

## 2022-08-24 DIAGNOSIS — H9312 Tinnitus, left ear: Secondary | ICD-10-CM | POA: Diagnosis not present

## 2022-08-24 MED ORDER — TIZANIDINE HCL 2 MG PO TABS: ORAL_TABLET | ORAL | 5 refills | Status: DC

## 2022-08-24 MED ORDER — NORTRIPTYLINE HCL 25 MG PO CAPS: ORAL_CAPSULE | ORAL | 0 refills | Status: DC

## 2022-08-24 NOTE — Progress Notes (Signed)
NEUROLOGY FOLLOW UP OFFICE NOTE  Michael Whitaker [id number]  Assessment/Plan:   1.  Cervicogenic headache 2.  Medication overuse headache 3.  Tinnitus in left ear  Restart nortriptyline 25mg  at bedtime titrating to 50mg  at bedtime.  Advised to contact me for refills. Restart tizanidine 2mg  to take as needed for neck pain and headache.  Advised to contact me for refills if needed. Limit use of pain relievers (such as Tylenol) to no more than 2 days out of week to prevent risk of rebound or medication-overuse headache. Keep headache diary For tinnitus, recommended using white noise (fans) to cancel out the sound or consider cognitive behavioral therapy Follow up 4-5 months.  Subjective:  Michael Whitaker is a 66 year old right-handed male whom I see for headache and neck pain who follows up for headache.   UPDATE: I had restarted him on nortriptyline and tizanidine last visit.  He did start it but hasn't taken them in a while.  Not sure why he didn't continue it, possibly no more refills.  He is back to having daily headaches.  Takes 2 Tylenol daily.  Lasts 30-45 minutes.     Saw ENT.  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   Current NSAIDS:  Celecoxib 200 mg twice daily as needed Current analgesics: None Current triptans: None Current ergotamine: None Current anti-emetic: None Current muscle relaxants: none Current anti-anxiolytic: None Current sleep aide: None Current Antihypertensive medications: Lisinopril, Toprol-XL Current Antidepressant medications: none Current Anticonvulsant medications: gabapentin 300mg -600mg  TID (not daily) Current anti-CGRP: None Current Vitamins/Herbal/Supplements: None Current Antihistamines/Decongestants: None Other therapy: None   HISTORY: Onset:  In 2017, he developed left sided radicular neck pain.  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  In July 2017, he underwent cervical laminectomy with posterior spinal fusion a C3-4, C4-5 and C5-6 due to neck and left sided radicular pain.  Following surgery, he has had neck and posterior head pain.  Cervical X-rays on 10/14/16 demonstrated adequate hardware without evidence of loosening. Location:  Occipital region, radiating into neck bilaterally Quality:  throbbing Initial Intensity:  3-4/10 Aura:  no Prodrome:  no Postdrome:  no Associated symptoms:  None.  No nausea, vomiting, photophobia, phonophobia or visual disturbance.  He has not had any new worse headache of his life, or waking up from sleep Initial Duration:  Constantly Initial Frequency:  daily Initial Frequency of abortive medication: daily Aggravating factors:  Neck movement Relieving factors:  Tylenol or Advil Activity:  Functions. Limited neck movement due to tightness and pain.  Still has pain radiating down both shoulders.  No arm/hand weakness or numbness.  He has history of left-sided sciatic pain.  MRI of lumbar spine from 06/27/2019 personally reviewed showed mild disc protrusion at L4-5 causing moderate left lateral recess stenosis possibly encroaching the left L5 nerve root.     Past NSAIDS:  Advil Past analgesics:  Tramadol, Tylenol Past abortive triptans:  no Past muscle relaxants:  Tizanidine 2 mg three times daily for neck pain Past antidepressant:  nortriptyline 50mg  Past antiepileptic: Gabapentin (ineffective) Other past therapies:  no  PAST MEDICAL HISTORY: Past Medical History:  Diagnosis Date   Body mass index (BMI) 35.0-35.9, adult    Chest pain    Chronic constipation    GERD (gastroesophageal reflux disease)    Gout  Hyperlipemia    Hypertension    Osteoarthritis    Tobacco use disorder     MEDICATIONS: Current Outpatient Medications on File Prior to Visit  Medication Sig  Dispense Refill   Candesartan Cilexetil-HCTZ 32-25 MG TABS Take 1 tablet by mouth daily.     colchicine 0.6 MG tablet Take 1 tablet (0.6 mg total) by mouth 2 (two) times daily. 20 tablet 0   metoprolol succinate (TOPROL-XL) 100 MG 24 hr tablet Take 1 tablet (100 mg total) by mouth daily. Take with or immediately following a meal. 30 tablet 0   omeprazole (PRILOSEC) 40 MG capsule Take 1 capsule by mouth daily.     simvastatin (ZOCOR) 10 MG tablet Take 10 mg by mouth daily.     allopurinol (ZYLOPRIM) 100 MG tablet Take 100 mg by mouth daily.     meloxicam (MOBIC) 7.5 MG tablet Take 1 tablet (7.5 mg total) by mouth daily. 30 tablet 0   nortriptyline (PAMELOR) 25 MG capsule Take 1 capsule at bedtime for one week, then increase to 2 capsules at bedtime (Patient not taking: Reported on 08/24/2022) 60 capsule 0   [DISCONTINUED] furosemide (LASIX) 40 MG tablet Take 1 tablet (40 mg total) by mouth daily. 30 tablet 0   No current facility-administered medications on file prior to visit.    ALLERGIES: No Known Allergies  FAMILY HISTORY: Family History  Problem Relation Age of Onset   Lung disease Father    Asthma Mother    Healthy Sister    Healthy Brother    Healthy Child    Neuropathy Neg Hx       Objective:  Blood pressure 118/79, pulse 81, height 6' (1.829 m), weight 270 lb (122.5 kg), SpO2 99 %. General: No acute distress.  Patient appears well-groomed.     Metta Clines, DO  CC: Jenean Lindau, MD

## 2022-08-24 NOTE — Patient Instructions (Signed)
Restart nortriptyline 25mg  at bedtime for one week, then increase to 50mg  at bedtime.  Contact me for refills Refilled tizanidine 2mg  three times daily as needed Limit use of pain relievers (such as Tylenol) to no more than 2 days out of week to prevent risk of rebound or medication-overuse headache. Follow up 6 months.

## 2022-09-06 DIAGNOSIS — E785 Hyperlipidemia, unspecified: Secondary | ICD-10-CM | POA: Diagnosis not present

## 2022-09-06 DIAGNOSIS — K219 Gastro-esophageal reflux disease without esophagitis: Secondary | ICD-10-CM | POA: Diagnosis not present

## 2022-09-06 DIAGNOSIS — K5909 Other constipation: Secondary | ICD-10-CM | POA: Diagnosis not present

## 2022-09-06 DIAGNOSIS — I10 Essential (primary) hypertension: Secondary | ICD-10-CM | POA: Diagnosis not present

## 2022-09-06 DIAGNOSIS — R609 Edema, unspecified: Secondary | ICD-10-CM | POA: Diagnosis not present

## 2022-09-06 DIAGNOSIS — Z79899 Other long term (current) drug therapy: Secondary | ICD-10-CM | POA: Diagnosis not present

## 2022-09-12 DIAGNOSIS — M189 Osteoarthritis of first carpometacarpal joint, unspecified: Secondary | ICD-10-CM | POA: Diagnosis not present

## 2022-09-12 DIAGNOSIS — M19032 Primary osteoarthritis, left wrist: Secondary | ICD-10-CM | POA: Diagnosis not present

## 2022-09-12 DIAGNOSIS — M25532 Pain in left wrist: Secondary | ICD-10-CM | POA: Diagnosis not present

## 2022-09-28 ENCOUNTER — Ambulatory Visit: Payer: BC Managed Care – PPO | Admitting: Neurology

## 2022-10-01 ENCOUNTER — Other Ambulatory Visit: Payer: Self-pay | Admitting: Neurology

## 2022-11-10 ENCOUNTER — Other Ambulatory Visit: Payer: Self-pay | Admitting: Neurology

## 2023-01-11 DIAGNOSIS — K219 Gastro-esophageal reflux disease without esophagitis: Secondary | ICD-10-CM | POA: Diagnosis not present

## 2023-01-11 DIAGNOSIS — I10 Essential (primary) hypertension: Secondary | ICD-10-CM | POA: Diagnosis not present

## 2023-01-11 DIAGNOSIS — E785 Hyperlipidemia, unspecified: Secondary | ICD-10-CM | POA: Diagnosis not present

## 2023-01-11 DIAGNOSIS — R600 Localized edema: Secondary | ICD-10-CM | POA: Diagnosis not present

## 2023-02-17 ENCOUNTER — Encounter: Payer: Self-pay | Admitting: Gastroenterology

## 2023-02-20 NOTE — Progress Notes (Unsigned)
NEUROLOGY FOLLOW UP OFFICE NOTE  ROXANNE DYCK [id number]  Assessment/Plan:   Now he is exhibiting more of a chronic tension type headache, possibly complicated by medication overuse    Increase nortriptyline to 75mg  at bedtime.  We can increase to 100mg  at bedtime in 2 months if needed (monitor blood pressure) Restart tizanidine 2mg  to take as needed for neck pain and headache  Limit use of pain relievers (such as Tylenol and Advil) to no more than 2 days out of week to prevent risk of rebound or medication-overuse headache. Follow up 6 months.   Subjective:  Jonathandavid Munkres is a 67 year old right-handed male whom I see for headache and neck pain who follows up for headache.   UPDATE: Now on nortriptyline.  Still daily but not all day.  Takes tizanidine and will resolve quickly.  Headaches are now NOT OCCIPITAL - they are across the forehead and sometimes bilaterally as well.      Current NSAIDS:  ibuprofen (2 a day - treats for overall body aches/arthritis) Current analgesics: None Current triptans: None Current ergotamine: None Current anti-emetic: None Current muscle relaxants: Tizanidine 2 mg three times daily for neck pain  Current anti-anxiolytic: None Current sleep aide: None Current Antihypertensive medications: Candesartan-HCTZ, Toprol-XL, furosemide Current Antidepressant medications: Nortriptyline 50 mg at bedtime  Current Anticonvulsant medications: gabapentin 300mg -600mg  TID (not daily) Current anti-CGRP: None Current Vitamins/Herbal/Supplements: None Current Antihistamines/Decongestants: None Other therapy: None   HISTORY: Onset:  In 2017, he developed left sided radicular neck pain.  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  In July 2017, he underwent cervical laminectomy with posterior  spinal fusion a C3-4, C4-5 and C5-6 due to neck and left sided radicular pain.  Following surgery, he has had neck and posterior head pain.  Cervical X-rays on 10/14/16 demonstrated adequate hardware without evidence of loosening. Location:  Occipital region, radiating into neck bilaterally Quality:  throbbing Initial Intensity:  3-4/10 Aura:  no Prodrome:  no Postdrome:  no Associated symptoms:  None.  No nausea, vomiting, photophobia, phonophobia or visual disturbance.  He has not had any new worse headache of his life, or waking up from sleep Initial Duration:  Constantly Initial Frequency:  daily Initial Frequency of abortive medication: daily Aggravating factors:  Neck movement Relieving factors:  Tylenol or Advil Activity:  Functions. Limited neck movement due to tightness and pain.  Still has pain radiating down both shoulders.  No arm/hand weakness or numbness.   He has history of left-sided sciatic pain.  MRI of lumbar spine from 06/27/2019 personally reviewed showed mild disc protrusion at L4-5 causing moderate left lateral recess stenosis possibly encroaching the left L5 nerve root.    Since 2023, he hears a hissing sound in his left ear.  No ear pain, aural fullness or hearing loss.  When he would pull or push on either ear, it seemed to stop.  He did have cerumen which his PCP cleaned out but the sound continued.  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     Past NSAIDS:  Celebrix Past analgesics:  Tramadol, Tylenol Past abortive triptans:  no Past muscle relaxants:  Yes (does not remember which one but it caused drowsiness) Past antiepileptic: Gabapentin (ineffective) Other past therapies:  no  PAST MEDICAL HISTORY: Past Medical History:  Diagnosis Date   Body mass index (BMI) 35.0-35.9, adult    Chest pain    Chronic constipation    GERD (gastroesophageal reflux disease)    Gout    Hyperlipemia    Hypertension     Osteoarthritis    Tobacco use disorder     MEDICATIONS: Current Outpatient Medications on File Prior to Visit  Medication Sig Dispense Refill   allopurinol (ZYLOPRIM) 100 MG tablet Take 100 mg by mouth daily.     Candesartan Cilexetil-HCTZ 32-25 MG TABS Take 1 tablet by mouth daily.     colchicine 0.6 MG tablet Take 1 tablet (0.6 mg total) by mouth 2 (two) times daily. 20 tablet 0   meloxicam (MOBIC) 7.5 MG tablet Take 1 tablet (7.5 mg total) by mouth daily. 30 tablet 0   metoprolol succinate (TOPROL-XL) 100 MG 24 hr tablet Take 1 tablet (100 mg total) by mouth daily. Take with or immediately following a meal. 30 tablet 0   nortriptyline (PAMELOR) 25 MG capsule 2 TABLETS AT BEDTIME 60 capsule 3   omeprazole (PRILOSEC) 40 MG capsule Take 1 capsule by mouth daily.     simvastatin (ZOCOR) 10 MG tablet Take 10 mg by mouth daily.     tiZANidine (ZANAFLEX) 2 MG tablet Take 1 tablet three times daily as needed. 90 tablet 5   [DISCONTINUED] furosemide (LASIX) 40 MG tablet Take 1 tablet (40 mg total) by mouth daily. 30 tablet 0   No current facility-administered medications on file prior to visit.    ALLERGIES: No Known Allergies  FAMILY HISTORY: Family History  Problem Relation Age of Onset   Lung disease Father    Asthma Mother    Healthy Sister    Healthy Brother    Healthy Child    Neuropathy Neg Hx       Objective:  Blood pressure 114/69, pulse 88, height 6\' 1"  (1.854 m), weight 280 lb 9.6 oz (127.3 kg), SpO2 94 %. General: No acute distress.  Patient appears well-groomed.   Head:  Normocephalic/atraumatic Eyes:  Fundi examined but not visualized Neck: supple, mild paraspinal tenderness, decreased range of motion Heart:  Regular rate and rhythm Lungs:  Clear to auscultation bilaterally Back: No paraspinal tenderness Neurological Exam: alert and oriented to person, place, and time.  Speech fluent and not dysarthric, language intact.  CN II-XII intact. Bulk and tone normal,  muscle strength 5/5 throughout.  Sensation to light touch intact.  Deep tendon reflexes 2+ throughout.  Finger to nose testing intact.  Gait normal, Romberg negative.   Metta Clines, DO  CC: Jenean Lindau, MD

## 2023-02-22 ENCOUNTER — Ambulatory Visit: Payer: BC Managed Care – PPO | Admitting: Neurology

## 2023-02-22 ENCOUNTER — Encounter: Payer: Self-pay | Admitting: Neurology

## 2023-02-22 VITALS — BP 114/69 | HR 88 | Ht 73.0 in | Wt 280.6 lb

## 2023-02-22 DIAGNOSIS — G44229 Chronic tension-type headache, not intractable: Secondary | ICD-10-CM

## 2023-02-22 MED ORDER — NORTRIPTYLINE HCL 75 MG PO CAPS: 75.0000 mg | ORAL_CAPSULE | Freq: Every day | ORAL | 5 refills | Status: DC

## 2023-02-22 NOTE — Patient Instructions (Addendum)
Increase nortriptyline to 75mg  at bedtime.  If no improvement in 2 months, contact me Tizanidine as needed Limit use of pain relievers (Advil) to no more than 2 days out of week to prevent risk of rebound or medication-overuse headache.

## 2023-03-16 DIAGNOSIS — H9042 Sensorineural hearing loss, unilateral, left ear, with unrestricted hearing on the contralateral side: Secondary | ICD-10-CM | POA: Diagnosis not present

## 2023-03-16 DIAGNOSIS — H9312 Tinnitus, left ear: Secondary | ICD-10-CM | POA: Diagnosis not present

## 2023-05-17 ENCOUNTER — Other Ambulatory Visit (INDEPENDENT_AMBULATORY_CARE_PROVIDER_SITE_OTHER): Payer: BC Managed Care – PPO

## 2023-05-17 ENCOUNTER — Ambulatory Visit: Payer: BC Managed Care – PPO | Admitting: Gastroenterology

## 2023-05-17 ENCOUNTER — Encounter: Payer: Self-pay | Admitting: Gastroenterology

## 2023-05-17 VITALS — BP 118/76 | HR 86 | Ht 73.0 in | Wt 282.0 lb

## 2023-05-17 DIAGNOSIS — K219 Gastro-esophageal reflux disease without esophagitis: Secondary | ICD-10-CM

## 2023-05-17 DIAGNOSIS — K5909 Other constipation: Secondary | ICD-10-CM

## 2023-05-17 DIAGNOSIS — Z1212 Encounter for screening for malignant neoplasm of rectum: Secondary | ICD-10-CM

## 2023-05-17 DIAGNOSIS — R1013 Epigastric pain: Secondary | ICD-10-CM

## 2023-05-17 DIAGNOSIS — Z1211 Encounter for screening for malignant neoplasm of colon: Secondary | ICD-10-CM

## 2023-05-17 LAB — CBC WITH DIFFERENTIAL/PLATELET
Basophils Absolute: 0.1 10*3/uL (ref 0.0–0.1)
Basophils Relative: 1 % (ref 0.0–3.0)
Eosinophils Absolute: 0.2 10*3/uL (ref 0.0–0.7)
Eosinophils Relative: 2 % (ref 0.0–5.0)
HCT: 42 % (ref 39.0–52.0)
Hemoglobin: 13.5 g/dL (ref 13.0–17.0)
Lymphocytes Relative: 32.5 % (ref 12.0–46.0)
Lymphs Abs: 3 10*3/uL (ref 0.7–4.0)
MCHC: 32.2 g/dL (ref 30.0–36.0)
MCV: 93.5 fl (ref 78.0–100.0)
Monocytes Absolute: 0.7 10*3/uL (ref 0.1–1.0)
Monocytes Relative: 7.1 % (ref 3.0–12.0)
Neutro Abs: 5.3 10*3/uL (ref 1.4–7.7)
Neutrophils Relative %: 57.4 % (ref 43.0–77.0)
Platelets: 277 10*3/uL (ref 150.0–400.0)
RBC: 4.5 Mil/uL (ref 4.22–5.81)
RDW: 14.7 % (ref 11.5–15.5)
WBC: 9.2 10*3/uL (ref 4.0–10.5)

## 2023-05-17 LAB — COMPREHENSIVE METABOLIC PANEL
ALT: 12 U/L (ref 0–53)
AST: 16 U/L (ref 0–37)
Albumin: 3.7 g/dL (ref 3.5–5.2)
Alkaline Phosphatase: 87 U/L (ref 39–117)
BUN: 21 mg/dL (ref 6–23)
CO2: 29 mEq/L (ref 19–32)
Calcium: 8.6 mg/dL (ref 8.4–10.5)
Chloride: 97 mEq/L (ref 96–112)
Creatinine, Ser: 1.18 mg/dL (ref 0.40–1.50)
GFR: 65.2 mL/min (ref >60.00)
Glucose, Bld: 153 mg/dL — ABNORMAL HIGH (ref 70–99)
Potassium: 3.4 mEq/L — ABNORMAL LOW (ref 3.5–5.1)
Sodium: 135 mEq/L (ref 135–145)
Total Bilirubin: 0.4 mg/dL (ref 0.2–1.2)
Total Protein: 7.5 g/dL (ref 6.0–8.3)

## 2023-05-17 LAB — TSH: TSH: 1.05 u[IU]/mL (ref 0.35–5.50)

## 2023-05-17 LAB — LIPASE: Lipase: 20 U/L (ref 11.0–59.0)

## 2023-05-17 MED ORDER — PANTOPRAZOLE SODIUM 40 MG PO TBEC: 40.0000 mg | DELAYED_RELEASE_TABLET | Freq: Every day | ORAL | 4 refills | Status: DC

## 2023-05-17 MED ORDER — CLENPIQ 10-3.5-12 MG-GM -GM/175ML PO SOLN: 1.0000 | ORAL | 0 refills | Status: DC

## 2023-05-17 NOTE — Progress Notes (Signed)
Chief Complaint: For GI workup  Referring Provider:  Wilmer Floor., MD      ASSESSMENT AND PLAN;   #1. Refractory GERD with epi pain  #2. CRC screening  #3. Chronic constipation  Plan: -Change omeprazole to protonix 40mg  po QD #90, 4RF -CBC, CMP, TSH, lipase -Miralax 17g po QD -EGD/colon with 2 day prep -Avoid NSAIDs. -If still with epi pain, ultrasound Abdo at a later date.   HPI:    Michael Whitaker is a 67 y.o. male  With gout, HTN, HLD  With refractory heartburn despite omeprazole 40 mg p.o. daily.  He has been using Tums on as-needed basis.  Denies having any odynophagia or dysphagia.  No regurgitation.  He has been using Advil 2/day for several months for shoulder arthritis.  Denies having any melena or hematochezia.  Occasional postprandial epi pain without nausea or vomiting.  More so when he misses a dose of omeprazole.  Has history of chronic constipation x several years.  Has been worse lately.  Currently having bowel movements every other day.  No melena or hematochezia.  He denies having any lower abdominal pain.  Got letters regarding repeat screening colonoscopy.  Quit ETOH/smoking in 1999.   Previous GI workup:  EGD 02/2006: Mild gastroduodenitis.  Negative CLO test. Colonoscopy ([facility]) 05/2013: Small internal hemorrhoids.  Otherwise normal.  Repeat in 10 years. CT Abdo/pelvis with contrast 05/2019: No acute abnormalities, mild colonic diverticulosis.  Past Medical History:  Diagnosis Date   Body mass index (BMI) 35.0-35.9, adult    Chest pain    Chronic constipation    GERD (gastroesophageal reflux disease)    Gout    History of colon polyps    Hyperlipemia    Hypertension    Osteoarthritis    Peptic ulcer    Tobacco use disorder     Past Surgical History:  Procedure Laterality Date   CARDIAC CATHETERIZATION  12/17/2004   Dr Ludwig Lean   CERVICAL SPINE SURGERY     COLONOSCOPY  05/08/2013   Small internal hemorrhoids.  Otherwise normal colonoscopy   ELBOW SURGERY     ESOPHAGOGASTRODUODENOSCOPY  02/23/2006   Mild gastroduodenitis. Otherwsie normal EGD   REPLACEMENT TOTAL HIP W/  RESURFACING IMPLANTS Left 02/28/2022   UMBILICAL HERNIA REPAIR      Family History  Problem Relation Age of Onset   Lung disease Father    Asthma Mother    Healthy Sister    Healthy Brother    Healthy Child    Neuropathy Neg Hx     Social History   Tobacco Use   Smoking status: Former    Packs/day: 1.5    Types: Cigarettes    Quit date: 10/30/2016    Years since quitting: 6.5   Smokeless tobacco: Never  Vaping Use   Vaping Use: Never used  Substance Use Topics   Alcohol use: Not Currently    Alcohol/week: 0.0 standard drinks of alcohol    Comment: "not since last year" 06/29/2017". one beer on 4th of july 2020.   Drug use: No    Current Outpatient Medications  Medication Sig Dispense Refill   allopurinol (ZYLOPRIM) 100 MG tablet Take 100 mg by mouth daily.     Candesartan Cilexetil-HCTZ 32-25 MG TABS Take 1 tablet by mouth daily.     furosemide (LASIX) 40 MG tablet Take 40 mg by mouth daily.     ibuprofen (ADVIL) 200 MG tablet Take 200 mg by mouth every 6 (six) hours as  needed.     metoprolol succinate (TOPROL-XL) 100 MG 24 hr tablet Take 1 tablet (100 mg total) by mouth daily. Take with or immediately following a meal. 30 tablet 0   nortriptyline (PAMELOR) 75 MG capsule Take 1 capsule (75 mg total) by mouth at bedtime. 30 capsule 5   tamsulosin (FLOMAX) 0.4 MG CAPS capsule Take 0.4 mg by mouth at bedtime.     tiZANidine (ZANAFLEX) 2 MG tablet Take 1 tablet three times daily as needed. 90 tablet 5   colchicine 0.6 MG tablet Take 1 tablet (0.6 mg total) by mouth 2 (two) times daily. 20 tablet 0   simvastatin (ZOCOR) 10 MG tablet Take 10 mg by mouth daily.     No current facility-administered medications for this visit.    No Known Allergies  Review of Systems:  Constitutional: Denies fever, chills,  diaphoresis, appetite change and fatigue.  HEENT: Has allergies. Respiratory: Denies SOB, DOE, cough, chest tightness,  and wheezing.   Cardiovascular: Denies chest pain, palpitations and leg swelling.  Genitourinary: Denies dysuria, urgency, frequency, hematuria, flank pain and difficulty urinating.  Musculoskeletal: Denies myalgias, back pain, joint swelling, arthralgias and gait problem.  Has arthritis. Skin: No rash.  Neurological: Denies dizziness, seizures, syncope, weakness, light-headedness, numbness and headaches.  Hematological: Denies adenopathy. Easy bruising, personal or family bleeding history  Psychiatric/Behavioral: No anxiety or depression     Physical Exam:    BP 118/76   Pulse 86   Ht 6\' 1"  (1.854 m)   Wt 282 lb (127.9 kg)   BMI 37.21 kg/m  Wt Readings from Last 3 Encounters:  05/17/23 282 lb (127.9 kg)  02/22/23 280 lb 9.6 oz (127.3 kg)  08/24/22 270 lb (122.5 kg)   Constitutional:  Well-developed, in no acute distress. Psychiatric: Normal mood and affect. Behavior is normal. HEENT: Pupils normal.  Conjunctivae are normal. No scleral icterus. Cardiovascular: Normal rate, regular rhythm. No edema Pulmonary/chest: Effort normal and breath sounds normal. No wheezing, rales or rhonchi. Abdominal: Soft, nondistended. Nontender. Bowel sounds active throughout. There are no masses palpable. No hepatomegaly. Rectal: Deferred Neurological: Alert and oriented to person place and time. Skin: Skin is warm and dry. No rashes noted.  Data Reviewed: I have personally reviewed following labs and imaging studies  CBC:    Latest Ref Rng & Units 04/03/2020    9:47 PM 05/23/2019   11:51 PM 08/15/2017    7:20 PM  CBC  WBC 4.0 - 10.5 K/uL 11.4  15.1  7.9   Hemoglobin 13.0 - 17.0 g/dL 16.1  09.6  04.5   Hematocrit 39.0 - 52.0 % 41.8  45.0  42.6   Platelets 150 - 400 K/uL 279  285  227     CMP:    Latest Ref Rng & Units 04/03/2020    9:47 PM 06/17/2019    4:12 PM  05/24/2019    3:02 AM  CMP  Glucose 70 - 99 mg/dL 409  83    BUN 8 - 23 mg/dL 13  15    Creatinine 8.11 - 1.24 mg/dL 9.14  7.82    Sodium 956 - 145 mmol/L 139  142    Potassium 3.5 - 5.1 mmol/L 3.7  4.6    Chloride 98 - 111 mmol/L 106  103    CO2 22 - 32 mmol/L 26  25    Calcium 8.9 - 10.3 mg/dL 8.2  8.8    Total Protein 6.5 - 8.1 g/dL 6.6   6.4  Total Bilirubin 0.3 - 1.2 mg/dL 0.4   1.1   Alkaline Phos 38 - 126 U/L 67   69   AST 15 - 41 U/L 21   27   ALT 0 - 44 U/L 18   20        Edman Circle, MD 05/17/2023, 2:17 PM  Cc: Wilmer Floor., MD

## 2023-05-17 NOTE — Patient Instructions (Signed)
_______________________________________________________  If your blood pressure at your visit was 140/90 or greater, please contact your primary care physician to follow up on this.  _______________________________________________________  If you are age 65 or older, your body mass index should be between 23-30. Your Body mass index is 37.21 kg/m. If this is out of the aforementioned range listed, please consider follow up with your Primary Care Provider.  If you are age 62 or younger, your body mass index should be between 19-25. Your Body mass index is 37.21 kg/m. If this is out of the aformentioned range listed, please consider follow up with your Primary Care Provider.   ________________________________________________________  The [place] [facility] providers would like to encourage you to use [facility] to communicate with providers for non-urgent requests or questions.  Due to long hold times on the telephone, sending your provider a message by [facility] may be a faster and more efficient way to get a response.  Please allow 48 business hours for a response.  Please remember that this is for non-urgent requests.  _______________________________________________________  Your provider has requested that you go to the basement level for lab work before leaving today. Press "B" on the elevator. The lab is located at the first door on the left as you exit the elevator.  We have sent the following medications to your pharmacy for you to pick up at your convenience: Protonix 40mg  daily. Stop omeprazole  Please purchase the following medications over the counter and take as directed: Miralax  Avoid NSAID's  Two days before your procedure: Mix 3 packs (or capfuls) of Miralax in 48 ounces of clear liquid and drink at 6pm.  You have been scheduled for an endoscopy and colonoscopy. Please follow the written instructions given to you at your visit today. Please pick up your prep supplies at the pharmacy  within the next 1-3 days. If you use inhalers (even only as needed), please bring them with you on the day of your procedure.  Thank you,  Dr. Lynann Bologna

## 2023-05-26 ENCOUNTER — Telehealth: Payer: Self-pay

## 2023-05-26 MED ORDER — POTASSIUM CHLORIDE CRYS ER 20 MEQ PO TBCR: 20.0000 meq | EXTENDED_RELEASE_TABLET | Freq: Every day | ORAL | 0 refills | Status: DC

## 2023-05-26 NOTE — Telephone Encounter (Signed)
-----   Message from Lynann Bologna, MD sent at 05/25/2023  9:40 PM EDT ----- Please inform the patient. All results normal or at baseline except potassium 3.4-likely due to Lasix. Cr 1.18 Start K-Dur 20 mg po every day #30.  He needs to get refills from Dr. Orvan Falconer Would also recommend rechecking BMP in 2 weeks.  Can be done through Dr. Blair Dolphin office. Send report to family physician

## 2023-05-26 NOTE — Telephone Encounter (Signed)
LVM for patient to call back. ?

## 2023-05-26 NOTE — Telephone Encounter (Signed)
Medication sent into pharmacy  

## 2023-05-29 NOTE — Telephone Encounter (Signed)
LVM and sent letter

## 2023-05-29 NOTE — Telephone Encounter (Signed)
Spoke with patient and he is aware to follow up with PCP and he has picked up the script

## 2023-06-05 NOTE — Telephone Encounter (Signed)
Inbound call from patient stating his follow up appointment with PCP is July 25th.

## 2023-06-06 ENCOUNTER — Encounter: Payer: Self-pay | Admitting: Gastroenterology

## 2023-06-06 NOTE — Telephone Encounter (Signed)
Noted  

## 2023-06-21 ENCOUNTER — Ambulatory Visit (AMBULATORY_SURGERY_CENTER): Payer: BC Managed Care – PPO | Admitting: Gastroenterology

## 2023-06-21 ENCOUNTER — Encounter: Payer: Self-pay | Admitting: Gastroenterology

## 2023-06-21 VITALS — BP 119/83 | HR 78 | Temp 97.1°F | Resp 13 | Ht 73.0 in | Wt 282.0 lb

## 2023-06-21 DIAGNOSIS — Z1211 Encounter for screening for malignant neoplasm of colon: Secondary | ICD-10-CM

## 2023-06-21 DIAGNOSIS — K219 Gastro-esophageal reflux disease without esophagitis: Secondary | ICD-10-CM

## 2023-06-21 DIAGNOSIS — R1013 Epigastric pain: Secondary | ICD-10-CM | POA: Diagnosis not present

## 2023-06-21 DIAGNOSIS — D123 Benign neoplasm of transverse colon: Secondary | ICD-10-CM

## 2023-06-21 DIAGNOSIS — K3189 Other diseases of stomach and duodenum: Secondary | ICD-10-CM | POA: Diagnosis not present

## 2023-06-21 DIAGNOSIS — K5909 Other constipation: Secondary | ICD-10-CM

## 2023-06-21 MED ORDER — SODIUM CHLORIDE 0.9 % IV SOLN
500.0000 mL | Freq: Once | INTRAVENOUS | Status: AC
Start: 2023-06-21 — End: ?

## 2023-06-21 NOTE — Progress Notes (Signed)
Chief Complaint: For GI workup  Referring Provider:  Wilmer Floor., MD      ASSESSMENT AND PLAN;   #1. Refractory GERD with epi pain  #2. CRC screening  #3. Chronic constipation  Plan: -Change omeprazole to protonix 40mg  po QD #90, 4RF -CBC, CMP, TSH, lipase -Miralax 17g po QD -EGD/colon with 2 day prep -Avoid NSAIDs. -If still with epi pain, ultrasound Abdo at a later date.  For EGD/colon today HPI:    Michael Whitaker is a 67 y.o. male  With gout, HTN, HLD  With refractory heartburn despite omeprazole 40 mg p.o. daily.  He has been using Tums on as-needed basis.  Denies having any odynophagia or dysphagia.  No regurgitation.  He has been using Advil 2/day for several months for shoulder arthritis.  Denies having any melena or hematochezia.  Occasional postprandial epi pain without nausea or vomiting.  More so when he misses a dose of omeprazole.  Has history of chronic constipation x several years.  Has been worse lately.  Currently having bowel movements every other day.  No melena or hematochezia.  He denies having any lower abdominal pain.  Got letters regarding repeat screening colonoscopy.  Quit ETOH/smoking in 1999.   Previous GI workup:  EGD 02/2006: Mild gastroduodenitis.  Negative CLO test. Colonoscopy ([facility]) 05/2013: Small internal hemorrhoids.  Otherwise normal.  Repeat in 10 years. CT Abdo/pelvis with contrast 05/2019: No acute abnormalities, mild colonic diverticulosis.  Past Medical History:  Diagnosis Date   Body mass index (BMI) 35.0-35.9, adult    Chest pain    Chronic constipation    GERD (gastroesophageal reflux disease)    Gout    History of colon polyps    Hyperlipemia    Hypertension    Osteoarthritis    Peptic ulcer    Tobacco use disorder     Past Surgical History:  Procedure Laterality Date   CARDIAC CATHETERIZATION  12/17/2004   Dr Ludwig Lean   CERVICAL SPINE SURGERY     COLONOSCOPY  05/08/2013   Small internal  hemorrhoids. Otherwise normal colonoscopy   ELBOW SURGERY     ESOPHAGOGASTRODUODENOSCOPY  02/23/2006   Mild gastroduodenitis. Otherwsie normal EGD   REPLACEMENT TOTAL HIP W/  RESURFACING IMPLANTS Left 02/28/2022   UMBILICAL HERNIA REPAIR      Family History  Problem Relation Age of Onset   Asthma Mother    Lung disease Father    Healthy Sister    Healthy Brother    Healthy Child    Neuropathy Neg Hx    Colon cancer Neg Hx    Esophageal cancer Neg Hx    Rectal cancer Neg Hx    Stomach cancer Neg Hx     Social History   Tobacco Use   Smoking status: Former    Current packs/day: 0.00    Types: Cigarettes    Quit date: 10/30/2016    Years since quitting: 6.6   Smokeless tobacco: Never  Vaping Use   Vaping status: Never Used  Substance Use Topics   Alcohol use: Not Currently    Alcohol/week: 0.0 standard drinks of alcohol    Comment: "not since last year" 06/29/2017". one beer on 4th of july 2020.   Drug use: No    Current Outpatient Medications  Medication Sig Dispense Refill   allopurinol (ZYLOPRIM) 100 MG tablet Take 100 mg by mouth daily.     Candesartan Cilexetil-HCTZ 32-25 MG TABS Take 1 tablet by mouth daily.  celecoxib (CELEBREX) 200 MG capsule      furosemide (LASIX) 40 MG tablet Take 40 mg by mouth daily.     metoprolol succinate (TOPROL-XL) 100 MG 24 hr tablet Take 1 tablet (100 mg total) by mouth daily. Take with or immediately following a meal. 30 tablet 0   nortriptyline (PAMELOR) 75 MG capsule Take 1 capsule (75 mg total) by mouth at bedtime. 30 capsule 5   pantoprazole (PROTONIX) 40 MG tablet Take 1 tablet (40 mg total) by mouth daily. 90 tablet 4   potassium chloride SA (KLOR-CON M) 20 MEQ tablet Take 1 tablet (20 mEq total) by mouth daily. Please contact your PCP regarding refills 30 tablet 0   simvastatin (ZOCOR) 10 MG tablet Take 10 mg by mouth daily.     tamsulosin (FLOMAX) 0.4 MG CAPS capsule Take 0.4 mg by mouth at bedtime.     tiZANidine  (ZANAFLEX) 2 MG tablet Take 1 tablet three times daily as needed. 90 tablet 5   colchicine 0.6 MG tablet Take 1 tablet (0.6 mg total) by mouth 2 (two) times daily. 20 tablet 0   ibuprofen (ADVIL) 200 MG tablet Take 200 mg by mouth every 6 (six) hours as needed.     tadalafil (CIALIS) 5 MG tablet Take by mouth.     Current Facility-Administered Medications  Medication Dose Route Frequency Provider Last Rate Last Admin   0.9 %  sodium chloride infusion  500 mL Intravenous Once Lynann Bologna, MD        No Known Allergies  Review of Systems:  Constitutional: Denies fever, chills, diaphoresis, appetite change and fatigue.  HEENT: Has allergies. Respiratory: Denies SOB, DOE, cough, chest tightness,  and wheezing.   Cardiovascular: Denies chest pain, palpitations and leg swelling.  Genitourinary: Denies dysuria, urgency, frequency, hematuria, flank pain and difficulty urinating.  Musculoskeletal: Denies myalgias, back pain, joint swelling, arthralgias and gait problem.  Has arthritis. Skin: No rash.  Neurological: Denies dizziness, seizures, syncope, weakness, light-headedness, numbness and headaches.  Hematological: Denies adenopathy. Easy bruising, personal or family bleeding history  Psychiatric/Behavioral: No anxiety or depression     Physical Exam:    BP 125/79   Pulse 82   Temp (!) 97.1 F (36.2 C)   Ht 6\' 1"  (1.854 m)   Wt 282 lb (127.9 kg)   SpO2 97%   BMI 37.21 kg/m  Wt Readings from Last 3 Encounters:  06/21/23 282 lb (127.9 kg)  05/17/23 282 lb (127.9 kg)  02/22/23 280 lb 9.6 oz (127.3 kg)   Constitutional:  Well-developed, in no acute distress. Psychiatric: Normal mood and affect. Behavior is normal. HEENT: Pupils normal.  Conjunctivae are normal. No scleral icterus. Cardiovascular: Normal rate, regular rhythm. No edema Pulmonary/chest: Effort normal and breath sounds normal. No wheezing, rales or rhonchi. Abdominal: Soft, nondistended. Nontender. Bowel sounds  active throughout. There are no masses palpable. No hepatomegaly. Rectal: Deferred Neurological: Alert and oriented to person place and time. Skin: Skin is warm and dry. No rashes noted.  Data Reviewed: I have personally reviewed following labs and imaging studies  CBC:    Latest Ref Rng & Units 05/17/2023    2:55 PM 04/03/2020    9:47 PM 05/23/2019   11:51 PM  CBC  WBC 4.0 - 10.5 K/uL 9.2  11.4  15.1   Hemoglobin 13.0 - 17.0 g/dL 16.1  09.6  04.5   Hematocrit 39.0 - 52.0 % 42.0  41.8  45.0   Platelets 150.0 - 400.0 K/uL 277.0  279  285     CMP:    Latest Ref Rng & Units 05/17/2023    2:55 PM 04/03/2020    9:47 PM 06/17/2019    4:12 PM  CMP  Glucose 70 - 99 mg/dL 884  166  83   BUN 6 - 23 mg/dL 21  13  15    Creatinine 0.40 - 1.50 mg/dL 0.63  0.16  0.10   Sodium 135 - 145 mEq/L 135  139  142   Potassium 3.5 - 5.1 mEq/L 3.4  3.7  4.6   Chloride 96 - 112 mEq/L 97  106  103   CO2 19 - 32 mEq/L 29  26  25    Calcium 8.4 - 10.5 mg/dL 8.6  8.2  8.8   Total Protein 6.0 - 8.3 g/dL 7.5  6.6    Total Bilirubin 0.2 - 1.2 mg/dL 0.4  0.4    Alkaline Phos 39 - 117 U/L 87  67    AST 0 - 37 U/L 16  21    ALT 0 - 53 U/L 12  18         Edman Circle, MD 06/21/2023, 1:47 PM  Cc: Wilmer Floor., MD

## 2023-06-21 NOTE — Op Note (Signed)
[facility] Patient Name: Michael Whitaker Procedure Date: 06/21/2023 1:49 PM MRN: [id number] Endoscopist: Lynann Bologna , MD, [id number] Age: 67 Referring MD:  Date of Birth: 1956-04-04 Gender: Male Account #: [id number] Procedure:                Colonoscopy Indications:              Screening for colorectal malignant neoplasm Medicines:                Monitored Anesthesia Care Procedure:                Pre-Anesthesia Assessment:                           - Prior to the procedure, a History and Physical                            was performed, and patient medications and                            allergies were reviewed. The patient's tolerance of                            previous anesthesia was also reviewed. The risks                            and benefits of the procedure and the sedation                            options and risks were discussed with the patient.                            All questions were answered, and informed consent                            was obtained. Prior Anticoagulants: The patient has                            taken no anticoagulant or antiplatelet agents. ASA                            Grade Assessment: II - A patient with mild systemic                            disease. After reviewing the risks and benefits,                            the patient was deemed in satisfactory condition to                            undergo the procedure.                           After obtaining informed consent, the colonoscope  was passed under direct vision. Throughout the                            procedure, the patient's blood pressure, pulse, and                            oxygen saturations were monitored continuously. The                            CF [id number] #[id number] was introduced through the anus                            and advanced to the the cecum, identified by                            appendiceal orifice  and ileocecal valve. The                            colonoscopy was performed without difficulty. The                            patient tolerated the procedure well. The quality                            of the bowel preparation was good. The ileocecal                            valve, appendiceal orifice, and rectum were                            photographed. Scope In: 2:09:40 PM Scope Out: 2:22:30 PM Scope Withdrawal Time: 0 hours 9 minutes 46 seconds  Total Procedure Duration: 0 hours 12 minutes 50 seconds  Findings:                 A few small-mouthed diverticula were found in the                            sigmoid colon.                           Non-bleeding internal hemorrhoids were found during                            retroflexion. The hemorrhoids were small and Grade                            I (internal hemorrhoids that do not prolapse).                           The exam was otherwise without abnormality on                            direct and retroflexion views. Complications:            No  immediate complications. Estimated Blood Loss:     Estimated blood loss: none. Impression:               - Mild sigmoid diverticulosis                           - Non-bleeding internal hemorrhoids.                           - The examination was otherwise normal on direct                            and retroflexion views.                           - No specimens collected. Recommendation:           - Patient has a contact number available for                            emergencies. The signs and symptoms of potential                            delayed complications were discussed with the                            patient. Return to normal activities tomorrow.                            Written discharge instructions were provided to the                            patient.                           - Resume previous diet.                           - Continue present  medications.                           - Repeat colonoscopy in 10 years for screening                            purposes. Earlier, if with any new problems or                            change in family history                           - The findings and recommendations were discussed                            with the patient's family. Lynann Bologna, MD 06/21/2023 2:25:55 PM This report has been signed electronically.

## 2023-06-21 NOTE — Progress Notes (Signed)
 Called to room to assist during endoscopic procedure.  Patient ID and intended procedure confirmed with present staff. Received instructions for my participation in the procedure from the performing physician.  

## 2023-06-21 NOTE — Patient Instructions (Addendum)
Handout on gastritis, diverticulosis, and hemorrhoids given to patient Await pathology results Resume previous diet and continue present medications including Protonix 40 mg daily  No ibuprofen, naproxen, or other non-steroidal anti-inflammatory medications Repeat colonoscopy in 10 years for surveillance unless new symptoms or new family history    YOU HAD AN ENDOSCOPIC PROCEDURE TODAY AT THE  ENDOSCOPY CENTER:   Refer to the procedure report that was given to you for any specific questions about what was found during the examination.  If the procedure report does not answer your questions, please call your gastroenterologist to clarify.  If you requested that your care partner not be given the details of your procedure findings, then the procedure report has been included in a sealed envelope for you to review at your convenience later.  YOU SHOULD EXPECT: Some feelings of bloating in the abdomen. Passage of more gas than usual.  Walking can help get rid of the air that was put into your GI tract during the procedure and reduce the bloating. If you had a lower endoscopy (such as a colonoscopy or flexible sigmoidoscopy) you may notice spotting of blood in your stool or on the toilet paper. If you underwent a bowel prep for your procedure, you may not have a normal bowel movement for a few days.  Please Note:  You might notice some irritation and congestion in your nose or some drainage.  This is from the oxygen used during your procedure.  There is no need for concern and it should clear up in a day or so.  SYMPTOMS TO REPORT IMMEDIATELY:  Following lower endoscopy (colonoscopy or flexible sigmoidoscopy):  Excessive amounts of blood in the stool  Significant tenderness or worsening of abdominal pains  Swelling of the abdomen that is new, acute  Fever of 100F or higher  Following upper endoscopy (EGD)  Vomiting of blood or coffee ground material  New chest pain or pain under the  shoulder blades  Painful or persistently difficult swallowing  New shortness of breath  Fever of 100F or higher  Black, tarry-looking stools  For urgent or emergent issues, a gastroenterologist can be reached at any hour by calling [phone]. Do not use MyChart messaging for urgent concerns.    DIET:  We do recommend a small meal at first, but then you may proceed to your regular diet.  Drink plenty of fluids but you should avoid alcoholic beverages for 24 hours.  ACTIVITY:  You should plan to take it easy for the rest of today and you should NOT DRIVE or use heavy machinery until tomorrow (because of the sedation medicines used during the test).    FOLLOW UP: Our staff will call the number listed on your records the next business day following your procedure.  We will call around 7:15- 8:00 am to check on you and address any questions or concerns that you may have regarding the information given to you following your procedure. If we do not reach you, we will leave a message.     If any biopsies were taken you will be contacted by phone or by letter within the next 1-3 weeks.  Please call us at [phone] if you have not heard about the biopsies in 3 weeks.    SIGNATURES/CONFIDENTIALITY: You and/or your care partner have signed paperwork which will be entered into your electronic medical record.  These signatures attest to the fact that that the information above on your After Visit Summary has been reviewed  and is understood.  Full responsibility of the confidentiality of this discharge information lies with you and/or your care-partner.

## 2023-06-21 NOTE — Progress Notes (Signed)
VS by EC  Pt's states no medical or surgical changes since previsit or office visit.  

## 2023-06-21 NOTE — Progress Notes (Signed)
 Report to PACU, RN, vss, BBS= Clear.  

## 2023-06-21 NOTE — Op Note (Signed)
[facility] Patient Name: Michael Whitaker Procedure Date: 06/21/2023 1:50 PM MRN: [id number] Endoscopist: Lynann Bologna , MD, [id number] Age: 67 Referring MD:  Date of Birth: May 01, 1956 Gender: Male Account #: [id number] Procedure:                Upper GI endoscopy Indications:              Epigastric abdominal pain Medicines:                Monitored Anesthesia Care Procedure:                Pre-Anesthesia Assessment:                           - Prior to the procedure, a History and Physical                            was performed, and patient medications and                            allergies were reviewed. The patient's tolerance of                            previous anesthesia was also reviewed. The risks                            and benefits of the procedure and the sedation                            options and risks were discussed with the patient.                            All questions were answered, and informed consent                            was obtained. Prior Anticoagulants: The patient has                            taken no anticoagulant or antiplatelet agents. ASA                            Grade Assessment: II - A patient with mild systemic                            disease. After reviewing the risks and benefits,                            the patient was deemed in satisfactory condition to                            undergo the procedure.                           After obtaining informed consent, the endoscope was  passed under direct vision. Throughout the                            procedure, the patient's blood pressure, pulse, and                            oxygen saturations were monitored continuously. The                            Olympus Scope [id number] was introduced through the                            mouth, and advanced to the second part of duodenum.                            The upper GI endoscopy was  accomplished without                            difficulty. The patient tolerated the procedure                            well. Scope In: Scope Out: Findings:                 The examined esophagus was normal with well-defined                            Z-line at 42 cm, examined by NBI.                           Localized mild inflammation characterized by                            erythema, friability and granularity was found in                            the gastric body and in the gastric antrum.                            Biopsies were taken with a cold forceps for                            histology.                           The examined duodenum was normal. Biopsies for                            histology were taken with a cold forceps for                            evaluation of celiac disease. Complications:            No immediate complications. Estimated Blood Loss:     Estimated blood loss: none. Impression:               -  Mod gastritis. Recommendation:           - Patient has a contact number available for                            emergencies. The signs and symptoms of potential                            delayed complications were discussed with the                            patient. Return to normal activities tomorrow.                            Written discharge instructions were provided to the                            patient.                           - Resume previous diet.                           - Continue present medications including Protonix                            40 mg p.o. daily.                           - No ibuprofen, naproxen, or other non-steroidal                            anti-inflammatory drugs.                           - Await pathology results.                           - The findings and recommendations were discussed                            with the patient's family. Lynann Bologna, MD 06/21/2023 2:07:27 PM This report has  been signed electronically.

## 2023-06-22 ENCOUNTER — Telehealth: Payer: Self-pay | Admitting: *Deleted

## 2023-06-22 NOTE — Telephone Encounter (Signed)
Attempted to call patient for their post-procedure follow-up call. No answer. Left voicemail.   

## 2023-06-29 ENCOUNTER — Encounter: Payer: Self-pay | Admitting: Gastroenterology

## 2023-06-29 DIAGNOSIS — I10 Essential (primary) hypertension: Secondary | ICD-10-CM | POA: Diagnosis not present

## 2023-06-29 DIAGNOSIS — I700 Atherosclerosis of aorta: Secondary | ICD-10-CM | POA: Diagnosis not present

## 2023-06-29 DIAGNOSIS — E785 Hyperlipidemia, unspecified: Secondary | ICD-10-CM | POA: Diagnosis not present

## 2023-06-29 DIAGNOSIS — K219 Gastro-esophageal reflux disease without esophagitis: Secondary | ICD-10-CM | POA: Diagnosis not present

## 2023-06-30 ENCOUNTER — Other Ambulatory Visit: Payer: Self-pay | Admitting: Neurology

## 2023-08-08 DIAGNOSIS — Z6835 Body mass index (BMI) 35.0-35.9, adult: Secondary | ICD-10-CM | POA: Diagnosis not present

## 2023-08-08 DIAGNOSIS — E1165 Type 2 diabetes mellitus with hyperglycemia: Secondary | ICD-10-CM | POA: Diagnosis not present

## 2023-08-21 NOTE — Progress Notes (Unsigned)
NEUROLOGY FOLLOW UP OFFICE NOTE  Michael Whitaker [id number]  Assessment/Plan:   Tension type headache Cervicogenic headache    Nortriptyline 75mg  at bedtime (monitor blood pressure) *** Tizanidine 2mg  to take as needed for neck pain and headache *** Limit use of pain relievers (such as Tylenol and Advil) to no more than 2 days out of week to prevent risk of rebound or medication-overuse headache. Follow up 6 months. ***   Subjective:  Michael Whitaker is a 67 year old right-handed male whom I see for headache and neck pain who follows up for headache.   UPDATE: To treat tension type headaches, increased nortriptyline to 75mg  at bedtime.  Restarted tizanidine 2mg  to take as needed.    Current NSAIDS:  ibuprofen (2 a day - treats for overall body aches/arthritis) Current analgesics: None Current triptans: None Current ergotamine: None Current anti-emetic: None Current muscle relaxants: Tizanidine 2 mg three times daily for neck pain  Current anti-anxiolytic: None Current sleep aide: None Current Antihypertensive medications: Candesartan-HCTZ, Toprol-XL, furosemide Current Antidepressant medications: Nortriptyline 75 mg at bedtime  Current Anticonvulsant medications: gabapentin 300mg -600mg  TID (not daily) Current anti-CGRP: None Current Vitamins/Herbal/Supplements: None Current Antihistamines/Decongestants: None Other therapy: None   HISTORY: Onset:  In 2017, he developed left sided radicular neck pain.  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  In July 2017, he underwent cervical laminectomy with posterior spinal fusion a C3-4, C4-5 and C5-6 due to neck and left sided radicular pain.  Following surgery, he has had neck and posterior head pain.  Cervical X-rays on 10/14/16 demonstrated adequate hardware  without evidence of loosening. Location:  Occipital region, radiating into neck bilaterally Quality:  throbbing Initial Intensity:  3-4/10 Aura:  no Prodrome:  no Postdrome:  no Associated symptoms:  None.  No nausea, vomiting, photophobia, phonophobia or visual disturbance.  He has not had any new worse headache of his life, or waking up from sleep Initial Duration:  Constantly Initial Frequency:  daily Initial Frequency of abortive medication: daily Aggravating factors:  Neck movement Relieving factors:  Tylenol or Advil Activity:  Functions. Limited neck movement due to tightness and pain.  Still has pain radiating down both shoulders.  No arm/hand weakness or numbness.  Also has tension-type headaches, pressure pain across the forehead and temples.     He has history of left-sided sciatic pain.  MRI of lumbar spine from 06/27/2019 personally reviewed showed mild disc protrusion at L4-5 causing moderate left lateral recess stenosis possibly encroaching the left L5 nerve root.    Since 2023, he hears a hissing sound in his left ear.  No ear pain, aural fullness or hearing loss.  When he would pull or push on either ear, it seemed to stop.  He did have cerumen which his PCP cleaned out but the sound continued.  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     Past NSAIDS:  Celebrix Past analgesics:  Tramadol, Tylenol Past abortive triptans:  no Past muscle relaxants:  Yes (does not remember which one but it caused drowsiness) Past antiepileptic: Gabapentin (ineffective) Other past therapies:  no  PAST MEDICAL HISTORY: Past Medical History:  Diagnosis Date   Body mass index (BMI) 35.0-35.9, adult    Chest pain    Chronic constipation    GERD (gastroesophageal reflux disease)  Gout    History of colon polyps    Hyperlipemia    Hypertension    Osteoarthritis    Peptic ulcer    Tobacco use disorder     MEDICATIONS: Current  Outpatient Medications on File Prior to Visit  Medication Sig Dispense Refill   allopurinol (ZYLOPRIM) 100 MG tablet Take 100 mg by mouth daily.     Candesartan Cilexetil-HCTZ 32-25 MG TABS Take 1 tablet by mouth daily.     celecoxib (CELEBREX) 200 MG capsule      colchicine 0.6 MG tablet Take 1 tablet (0.6 mg total) by mouth 2 (two) times daily. 20 tablet 0   furosemide (LASIX) 40 MG tablet Take 40 mg by mouth daily.     ibuprofen (ADVIL) 200 MG tablet Take 200 mg by mouth every 6 (six) hours as needed.     metoprolol succinate (TOPROL-XL) 100 MG 24 hr tablet Take 1 tablet (100 mg total) by mouth daily. Take with or immediately following a meal. 30 tablet 0   nortriptyline (PAMELOR) 75 MG capsule Take 1 capsule (75 mg total) by mouth at bedtime. 30 capsule 5   pantoprazole (PROTONIX) 40 MG tablet Take 1 tablet (40 mg total) by mouth daily. 90 tablet 4   potassium chloride SA (KLOR-CON M) 20 MEQ tablet Take 1 tablet (20 mEq total) by mouth daily. Please contact your PCP regarding refills 30 tablet 0   simvastatin (ZOCOR) 10 MG tablet Take 10 mg by mouth daily.     tadalafil (CIALIS) 5 MG tablet Take by mouth.     tamsulosin (FLOMAX) 0.4 MG CAPS capsule Take 0.4 mg by mouth at bedtime.     tiZANidine (ZANAFLEX) 2 MG tablet Take 1 tablet three times daily as needed. 90 tablet 5   Current Facility-Administered Medications on File Prior to Visit  Medication Dose Route Frequency Provider Last Rate Last Admin   0.9 %  sodium chloride infusion  500 mL Intravenous Once Lynann Bologna, MD        ALLERGIES: No Known Allergies  FAMILY HISTORY: Family History  Problem Relation Age of Onset   Asthma Mother    Lung disease Father    Healthy Sister    Healthy Brother    Healthy Child    Neuropathy Neg Hx    Colon cancer Neg Hx    Esophageal cancer Neg Hx    Rectal cancer Neg Hx    Stomach cancer Neg Hx       Objective:  *** General: No acute distress.  Patient appears well-groomed.    Head:  Normocephalic/atraumatic Eyes:  Fundi examined but not visualized Neck: supple, mild paraspinal tenderness, decreased range of motion Heart:  Regular rate and rhythm Neurological Exam: ***   Michael Millet, DO  CC: Junious Dresser, MD

## 2023-08-23 ENCOUNTER — Ambulatory Visit: Payer: BC Managed Care – PPO | Admitting: Neurology

## 2023-08-23 ENCOUNTER — Encounter: Payer: Self-pay | Admitting: Neurology

## 2023-08-23 VITALS — BP 103/63 | HR 84 | Ht 74.0 in | Wt 265.0 lb

## 2023-08-23 DIAGNOSIS — M542 Cervicalgia: Secondary | ICD-10-CM

## 2023-08-23 DIAGNOSIS — G44219 Episodic tension-type headache, not intractable: Secondary | ICD-10-CM

## 2023-08-23 DIAGNOSIS — G4486 Cervicogenic headache: Secondary | ICD-10-CM | POA: Diagnosis not present

## 2023-08-23 MED ORDER — NORTRIPTYLINE HCL 75 MG PO CAPS: 75.0000 mg | ORAL_CAPSULE | Freq: Every day | ORAL | 8 refills | Status: DC

## 2023-08-23 MED ORDER — TIZANIDINE HCL 2 MG PO TABS: ORAL_TABLET | ORAL | 8 refills | Status: DC

## 2023-08-23 NOTE — Patient Instructions (Addendum)
Refer to pain management for chronic neck pain.  If you don't get a call in a week, contact us Continue nortriptyline 75mg  at bedtime Tizanidine as needed for acute neck pain and headache Follow up 9 months.

## 2023-10-25 DIAGNOSIS — K219 Gastro-esophageal reflux disease without esophagitis: Secondary | ICD-10-CM | POA: Diagnosis not present

## 2023-10-25 DIAGNOSIS — E785 Hyperlipidemia, unspecified: Secondary | ICD-10-CM | POA: Diagnosis not present

## 2023-10-25 DIAGNOSIS — I10 Essential (primary) hypertension: Secondary | ICD-10-CM | POA: Diagnosis not present

## 2023-10-25 DIAGNOSIS — E1165 Type 2 diabetes mellitus with hyperglycemia: Secondary | ICD-10-CM | POA: Diagnosis not present

## 2023-10-28 ENCOUNTER — Ambulatory Visit: Payer: BC Managed Care – PPO

## 2023-10-28 ENCOUNTER — Ambulatory Visit
Admission: EM | Admit: 2023-10-28 | Discharge: 2023-10-28 | Disposition: A | Discharge status: Home / Self Care | Payer: BC Managed Care – PPO | Attending: Internal Medicine | Admitting: Internal Medicine

## 2023-10-28 DIAGNOSIS — M25532 Pain in left wrist: Secondary | ICD-10-CM | POA: Diagnosis not present

## 2023-10-28 DIAGNOSIS — M19042 Primary osteoarthritis, left hand: Secondary | ICD-10-CM | POA: Diagnosis not present

## 2023-10-28 DIAGNOSIS — M19032 Primary osteoarthritis, left wrist: Secondary | ICD-10-CM | POA: Diagnosis not present

## 2023-10-28 DIAGNOSIS — M7989 Other specified soft tissue disorders: Secondary | ICD-10-CM | POA: Diagnosis not present

## 2023-10-28 DIAGNOSIS — M79642 Pain in left hand: Secondary | ICD-10-CM | POA: Diagnosis not present

## 2023-10-28 MED ORDER — PREDNISONE 20 MG PO TABS
40.0000 mg | ORAL_TABLET | Freq: Every day | ORAL | 0 refills | Status: AC
Start: 2023-10-28 — End: 2023-11-02

## 2023-10-28 NOTE — ED Provider Notes (Signed)
[name]    CSN: [id number] Arrival date & time: 10/28/23  3664      History   Chief Complaint Chief Complaint  Patient presents with   Pain    HPI Michael Whitaker is a 67 y.o. male.   Patient here today for evaluation of left wrist pain that radiates into his left hand.  He has had a an area of swelling to the outside of his wrist for a long time however he reports that recently he had developed the left wrist pain.  He states that his doctor had informed him not to worry about the nodule to his left wrist if it was not bothering him.  He has not had any injury that he is aware of.  He denies any numbness or tingling.  The history is provided by the patient.    Past Medical History:  Diagnosis Date   Body mass index (BMI) 35.0-35.9, adult    Chest pain    Chronic constipation    GERD (gastroesophageal reflux disease)    Gout    History of colon polyps    Hyperlipemia    Hypertension    Osteoarthritis    Peptic ulcer    Tobacco use disorder     Patient Active Problem List   Diagnosis Date Noted   Sensorineural hearing loss (SNHL) of left ear with unrestricted hearing of right ear 03/16/2023   Asymmetric SNHL (sensorineural hearing loss) 07/04/2022   Tinnitus, left ear 07/04/2022   Pain in joint of right knee 10/05/2020   Trigger finger, acquired 01/27/2015   Fracture of fifth metacarpal bone of left hand 01/27/2015   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 Otherwise normal colonoscopy   ELBOW SURGERY     ESOPHAGOGASTRODUODENOSCOPY   02/23/2006   Mild gastroduodenitis. Otherwsie normal EGD   REPLACEMENT TOTAL HIP W/  RESURFACING IMPLANTS Left 02/28/2022   UMBILICAL HERNIA REPAIR         Home Medications    Prior to Admission medications   Medication Sig Start Date End Date Taking? Authorizing Provider  predniSONE (DELTASONE) 20 MG tablet Take 2 tablets (40 mg total) by mouth daily with breakfast for 5 days. 10/28/23 11/02/23 Yes Tomi Bamberger, PA-C  allopurinol (ZYLOPRIM) 100 MG tablet Take 100 mg by mouth daily. 07/28/22   [provider]  Candesartan Cilexetil-HCTZ 32-25 MG TABS Take 1 tablet by mouth daily. 12/10/18   [provider]  celecoxib (CELEBREX) 200 MG capsule     [provider]  colchicine 0.6 MG tablet Take 1 tablet (0.6 mg total) by mouth 2 (two) times daily. 04/29/22 08/23/23  Elson Areas, PA-C  furosemide (LASIX) 40 MG tablet Take 40 mg by mouth daily.    [provider]  ibuprofen (ADVIL) 200 MG tablet Take 200 mg by mouth every 6 (six) hours as needed.    [provider]  metoprolol succinate (TOPROL-XL) 100 MG 24 hr tablet Take 1 tablet (100 mg total) by mouth daily. Take with or immediately following a meal. 01/19/15   Gerhard Munch, MD  nortriptyline (  PAMELOR) 75 MG capsule Take 1 capsule (75 mg total) by mouth at bedtime. 08/23/23   Everlena Cooper, Adam R, DO  pantoprazole (PROTONIX) 40 MG tablet Take 1 tablet (40 mg total) by mouth daily. 05/17/23   Lynann Bologna, MD  RYBELSUS 3 MG TABS Take 1 tablet by mouth daily. 08/19/23   [provider]  simvastatin (ZOCOR) 10 MG tablet Take 10 mg by mouth daily. 04/10/22   [provider]  tadalafil (CIALIS) 5 MG tablet Take by mouth. 09/22/11   [provider]  tamsulosin (FLOMAX) 0.4 MG CAPS capsule Take 0.4 mg by mouth at bedtime.    [provider]  tiZANidine (ZANAFLEX) 2 MG tablet Take 1 tablet three times daily as needed. 08/23/23   Drema Dallas, DO    Family  History Family History  Problem Relation Age of Onset   Asthma Mother    Lung disease Father    Healthy Sister    Healthy Brother    Healthy Child    Neuropathy Neg Hx    Colon cancer Neg Hx    Esophageal cancer Neg Hx    Rectal cancer Neg Hx    Stomach cancer Neg Hx     Social History Social History   Tobacco Use   Smoking status: Former    Current packs/day: 0.00    Types: Cigarettes    Quit date: 10/30/2016    Years since quitting: 6.9   Smokeless tobacco: Never  Vaping Use   Vaping status: Never Used  Substance Use Topics   Alcohol use: Not Currently    Alcohol/week: 0.0 standard drinks of alcohol    Comment: "not since last year" 06/29/2017". one beer on 4th of july 2020.   Drug use: No     Allergies   Patient has no known allergies.   Review of Systems Review of Systems  Constitutional:  Negative for chills and fever.  Eyes:  Negative for discharge and redness.  Musculoskeletal:  Positive for arthralgias.  Skin:  Positive for color change and wound.  Neurological:  Negative for numbness.     Physical Exam Triage Vital Signs ED Triage Vitals  Encounter Vitals Group     BP 10/28/23 1028 115/78     Systolic BP Percentile --      Diastolic BP Percentile --      Pulse Rate 10/28/23 1028 80     Resp 10/28/23 1028 18     Temp 10/28/23 1028 (!) 97.4 F (36.3 C)     Temp Source 10/28/23 1028 Oral     SpO2 10/28/23 1028 96 %     Weight 10/28/23 1027 264 lb 15.9 oz (120.2 kg)     Height 10/28/23 1027 6\' 2"  (1.88 m)     Head Circumference --      Peak Flow --      Pain Score 10/28/23 1023 10     Pain Loc --      Pain Education --      Exclude from Growth Chart --    No data found.  Updated Vital Signs BP 115/78 (BP Location: Left Arm)   Pulse 80   Temp (!) 97.4 F (36.3 C) (Oral)   Resp 18   Ht 6\' 2"  (1.88 m)   Wt 264 lb 15.9 oz (120.2 kg)   SpO2 96%   BMI 34.02 kg/m      Physical Exam Vitals and nursing note reviewed.  Constitutional:       General:  He is not in acute distress.    Appearance: Normal appearance. He is not ill-appearing.  HENT:     Head: Normocephalic and atraumatic.  Eyes:     Conjunctiva/sclera: Conjunctivae normal.  Cardiovascular:     Rate and Rhythm: Normal rate.  Pulmonary:     Effort: Pulmonary effort is normal. No respiratory distress.  Musculoskeletal:     Comments: Decreased range of motion of left wrist due to pain.  Nodular lesion noted to ulnar aspect of left wrist.  No tenderness palpation to left wrist diffusely.  Neurological:     Mental Status: He is alert.     Comments: Grip strength 3 out of 5 to right 2 out of 5 to left as patient is unable to fully close fist, gross sensation intact to distal left fingers  Psychiatric:        Mood and Affect: Mood normal.        Behavior: Behavior normal.        Thought Content: Thought content normal.      UC Treatments / Results  Labs (all labs ordered are listed, but only abnormal results are displayed) Labs Reviewed - No data to display  EKG   Radiology DG Hand Complete Left  Result Date: 10/28/2023 CLINICAL DATA:  Pain and swelling.  Decreased range of motion. EXAM: LEFT HAND - COMPLETE 3+ VIEW COMPARISON:  09/12/2022 FINDINGS: No evidence for an acute fracture. No dislocation. Evidence of lunatotriquetral coalition again noted. Degenerative changes are noted in scattered MCP and IP joints of the hand as well as in the radiocarpal joint. Deformity in the distal radius and ulna may reflect sequelae of old trauma. IMPRESSION: 1. No acute bony findings. 2. Degenerative changes in the MCP and IP joints of the hand as well as in the radiocarpal joint. 3. Lunatotriquetral coalition. Electronically Signed   By: Kennith Center M.D.   On: 10/28/2023 10:55   DG Wrist Complete Left  Result Date: 10/28/2023 CLINICAL DATA:  Pain and swelling with decreased range of motion. EXAM: LEFT WRIST - COMPLETE 3+ VIEW COMPARISON:  09/12/2022 FINDINGS:  Fusion of the triquetrum and lunate bone is again noted. No signs of acute fracture or dislocation. Mild narrowing of the radiocarpal joint space. There are cystic changes identified about the distal ulna and ulnar styloid. Overlying soft tissue swelling is noted. Mild degenerative changes identified at the basilar joint. IMPRESSION: 1. No fracture or dislocation. 2. Cystic changes about the distal ulna and ulnar styloid with overlying soft tissue swelling. Findings are nonspecific but may reflect inflammatory arthropathy. 3. Fusion of the triquetrum and lunate bone. Electronically Signed   By: Signa Kell M.D.   On: 10/28/2023 10:53    Procedures Procedures (including critical care time)  Medications Ordered in UC Medications - No data to display  Initial Impression / Assessment and Plan / UC Course  I have reviewed the triage vital signs and the nursing notes.  Pertinent labs & imaging results that were available during my care of the patient were reviewed by me and considered in my medical decision making (see chart for details).    X-ray without acute findings.  Suspect likely inflammatory cause of pain and will treat with steroid burst.  Encouraged patient to follow-up with Ortho if symptoms persist.  Patient expresses understanding.  Final Clinical Impressions(s) / UC Diagnoses   Final diagnoses:  Left wrist pain   Discharge Instructions   None    ED Prescriptions  Medication Sig Dispense Auth. Provider   predniSONE (DELTASONE) 20 MG tablet Take 2 tablets (40 mg total) by mouth daily with breakfast for 5 days. 10 tablet Tomi Bamberger, PA-C      PDMP not reviewed this encounter.   Tomi Bamberger, PA-C 10/28/23 1146

## 2023-10-28 NOTE — ED Triage Notes (Signed)
"  I am having left wrist pain with pain radiating to left hand". "I do have a nodule on outside of wrist but that has been there, my doctor tells me if it doesn't bother me, don't mess with it". ? Injury (none known).

## 2023-11-07 DIAGNOSIS — Z6835 Body mass index (BMI) 35.0-35.9, adult: Secondary | ICD-10-CM | POA: Diagnosis not present

## 2023-11-07 DIAGNOSIS — M159 Polyosteoarthritis, unspecified: Secondary | ICD-10-CM | POA: Diagnosis not present

## 2023-11-07 DIAGNOSIS — F17200 Nicotine dependence, unspecified, uncomplicated: Secondary | ICD-10-CM | POA: Diagnosis not present

## 2023-11-07 DIAGNOSIS — E1165 Type 2 diabetes mellitus with hyperglycemia: Secondary | ICD-10-CM | POA: Diagnosis not present

## 2024-03-06 DIAGNOSIS — Z6835 Body mass index (BMI) 35.0-35.9, adult: Secondary | ICD-10-CM | POA: Diagnosis not present

## 2024-03-06 DIAGNOSIS — K5909 Other constipation: Secondary | ICD-10-CM | POA: Diagnosis not present

## 2024-03-06 DIAGNOSIS — K219 Gastro-esophageal reflux disease without esophagitis: Secondary | ICD-10-CM | POA: Diagnosis not present

## 2024-03-06 DIAGNOSIS — I700 Atherosclerosis of aorta: Secondary | ICD-10-CM | POA: Diagnosis not present

## 2024-03-06 DIAGNOSIS — E1165 Type 2 diabetes mellitus with hyperglycemia: Secondary | ICD-10-CM | POA: Diagnosis not present

## 2024-03-06 DIAGNOSIS — E785 Hyperlipidemia, unspecified: Secondary | ICD-10-CM | POA: Diagnosis not present

## 2024-03-06 DIAGNOSIS — I10 Essential (primary) hypertension: Secondary | ICD-10-CM | POA: Diagnosis not present

## 2024-03-06 DIAGNOSIS — M159 Polyosteoarthritis, unspecified: Secondary | ICD-10-CM | POA: Diagnosis not present

## 2024-03-06 DIAGNOSIS — F17200 Nicotine dependence, unspecified, uncomplicated: Secondary | ICD-10-CM | POA: Diagnosis not present

## 2024-05-21 NOTE — Progress Notes (Signed)
 NEUROLOGY FOLLOW UP OFFICE NOTE  Michael Whitaker [id number]  Assessment/Plan:   Tension type headache - stable Cervicogenic headache -stable Chronic neck pain status post failed surgery    Nortriptyline  75mg  at bedtime Tizanidine  2mg  to take as needed for neck pain and headache  Limit use of pain relievers (such as Tylenol  and Advil ) to no more than 9 days out of month to prevent risk of rebound or medication-overuse headache. Follow up 1 year    Subjective:  Michael Whitaker is a 68 year old right-handed male who follows up for headache and neck pain.   UPDATE: Headaches are infrequent.  Treats with Tylenol  Extra-strength.  Occurs 1 to 2 times a week.  Still with persistent neck and arm pain.      Current NSAIDS:  ibuprofen  (2 a day - treats for overall body aches/arthritis) Current analgesics: None Current triptans: None Current ergotamine: None Current anti-emetic: None Current muscle relaxants: Tizanidine  2 mg three times daily for neck pain  Current anti-anxiolytic: None Current sleep aide: None Current Antihypertensive medications: Candesartan-HCTZ, Toprol -XL, furosemide  Current Antidepressant medications: Nortriptyline  75 mg at bedtime  Current Anticonvulsant medications: gabapentin  300mg -600mg  TID (not daily) Current anti-CGRP: None Current Vitamins/Herbal/Supplements: None Current Antihistamines/Decongestants: None Other therapy: None   HISTORY: Onset:  In 2017, he developed left sided radicular neck pain.  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  In July 2017, he underwent cervical laminectomy with posterior spinal fusion a C3-4, C4-5 and C5-6 due to neck and left sided radicular pain.  Following surgery, he has had neck and posterior head pain.  Cervical X-rays on 10/14/16 demonstrated  adequate hardware without evidence of loosening. Location:  Occipital region, radiating into neck bilaterally Quality:  throbbing Initial Intensity:  3-4/10 Aura:  no Prodrome:  no Postdrome:  no Associated symptoms:  None.  No nausea, vomiting, photophobia, phonophobia or visual disturbance.  He has not had any new worse headache of his life, or waking up from sleep Initial Duration:  Constantly Initial Frequency:  daily Initial Frequency of abortive medication: daily Aggravating factors:  Neck movement Relieving factors:  Tylenol  or Advil  Activity:  Functions. Limited neck movement due to tightness and pain.  Still has pain radiating down both shoulders.  No arm/hand weakness or numbness.  Also has tension-type headaches, pressure pain across the forehead and temples.     He has history of left-sided sciatic pain.  MRI of lumbar spine from 06/27/2019 personally reviewed showed mild disc protrusion at L4-5 causing moderate left lateral recess stenosis possibly encroaching the left L5 nerve root.    Since 2023, he hears a hissing sound in his left ear.  No ear pain, aural fullness or hearing loss.  When he would pull or push on either ear, it seemed to stop.  He did have cerumen which his PCP cleaned out but the sound continued.  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     Past NSAIDS:  Celebrix Past analgesics:  Tramadol , Tylenol  Past abortive triptans:  no Past muscle relaxants:  Yes (does not remember which one but it caused drowsiness) Past antiepileptic: Gabapentin  (ineffective) Other past therapies:  no  PAST MEDICAL HISTORY: Past Medical History:  Diagnosis Date   Body mass index (BMI) 35.0-35.9, adult    Chest pain    Chronic constipation  GERD (gastroesophageal reflux disease)    Gout    History of colon polyps    Hyperlipemia    Hypertension    Osteoarthritis    Peptic ulcer    Tobacco use disorder      MEDICATIONS: Current Outpatient Medications on File Prior to Visit  Medication Sig Dispense Refill   allopurinol (ZYLOPRIM) 100 MG tablet Take 100 mg by mouth daily.     Candesartan Cilexetil-HCTZ 32-25 MG TABS Take 1 tablet by mouth daily.     celecoxib (CELEBREX) 200 MG capsule      colchicine  0.6 MG tablet Take 1 tablet (0.6 mg total) by mouth 2 (two) times daily. 20 tablet 0   furosemide  (LASIX ) 40 MG tablet Take 40 mg by mouth daily.     ibuprofen  (ADVIL ) 200 MG tablet Take 200 mg by mouth every 6 (six) hours as needed.     metoprolol  succinate (TOPROL -XL) 100 MG 24 hr tablet Take 1 tablet (100 mg total) by mouth daily. Take with or immediately following a meal. 30 tablet 0   nortriptyline  (PAMELOR ) 75 MG capsule Take 1 capsule (75 mg total) by mouth at bedtime. 30 capsule 8   pantoprazole  (PROTONIX ) 40 MG tablet Take 1 tablet (40 mg total) by mouth daily. 90 tablet 4   RYBELSUS 3 MG TABS Take 1 tablet by mouth daily.     simvastatin (ZOCOR) 10 MG tablet Take 10 mg by mouth daily.     tadalafil (CIALIS) 5 MG tablet Take by mouth.     tamsulosin (FLOMAX) 0.4 MG CAPS capsule Take 0.4 mg by mouth at bedtime.     tiZANidine  (ZANAFLEX ) 2 MG tablet Take 1 tablet three times daily as needed. 90 tablet 8   Current Facility-Administered Medications on File Prior to Visit  Medication Dose Route Frequency Provider Last Rate Last Admin   0.9 %  sodium chloride  infusion  500 mL Intravenous Once Charlanne Groom, MD        ALLERGIES: No Known Allergies  FAMILY HISTORY: Family History  Problem Relation Age of Onset   Asthma Mother    Lung disease Father    Healthy Sister    Healthy Brother    Healthy Child    Neuropathy Neg Hx    Colon cancer Neg Hx    Esophageal cancer Neg Hx    Rectal cancer Neg Hx    Stomach cancer Neg Hx       Objective:  Blood pressure 106/71, pulse 82, height 6' 1 (1.854 m), weight 273 lb (123.8 kg), SpO2 98%. General: No acute distress.  Patient appears  well-groomed.   Head:  Normocephalic/atraumatic Neck:  Supple.   paraspinal tenderness.  Full range of motion. Heart:  Regular rate and rhythm. Neuro:  Alert and oriented.  Speech fluent and not dysarthric.  Language intact.  CN II-XII intact.  Bulk and tone normal.  Muscle strength 5/5 throughout.  Sensation to light touch intact.  Deep tendon reflexes 2+ throughout, toes downgoing.  Gait cautious.  Romberg negative.    Juliene Dunnings, DO  CC: Garnette Shams, MD

## 2024-05-23 ENCOUNTER — Encounter: Payer: Self-pay | Admitting: Neurology

## 2024-05-23 ENCOUNTER — Ambulatory Visit (INDEPENDENT_AMBULATORY_CARE_PROVIDER_SITE_OTHER): Payer: BC Managed Care – PPO | Admitting: Neurology

## 2024-05-23 VITALS — BP 106/71 | HR 82 | Ht 73.0 in | Wt 273.0 lb

## 2024-05-23 DIAGNOSIS — M542 Cervicalgia: Secondary | ICD-10-CM

## 2024-05-23 DIAGNOSIS — G44219 Episodic tension-type headache, not intractable: Secondary | ICD-10-CM | POA: Diagnosis not present

## 2024-05-23 MED ORDER — TIZANIDINE HCL 2 MG PO TABS: ORAL_TABLET | ORAL | 11 refills | Status: AC

## 2024-05-23 MED ORDER — NORTRIPTYLINE HCL 75 MG PO CAPS: 75.0000 mg | ORAL_CAPSULE | Freq: Every day | ORAL | 11 refills | Status: AC

## 2024-05-23 NOTE — Patient Instructions (Signed)
 Refilled nortriptyline  and tizanidine  Limit use of pain relievers to no more than 9 days out of the month to prevent risk of rebound or medication-overuse headache.

## 2024-06-18 DIAGNOSIS — I10 Essential (primary) hypertension: Secondary | ICD-10-CM | POA: Diagnosis not present

## 2024-06-18 DIAGNOSIS — Z6835 Body mass index (BMI) 35.0-35.9, adult: Secondary | ICD-10-CM | POA: Diagnosis not present

## 2024-06-18 DIAGNOSIS — N400 Enlarged prostate without lower urinary tract symptoms: Secondary | ICD-10-CM | POA: Diagnosis not present

## 2024-06-18 DIAGNOSIS — K219 Gastro-esophageal reflux disease without esophagitis: Secondary | ICD-10-CM | POA: Diagnosis not present

## 2024-06-18 DIAGNOSIS — Z008 Encounter for other general examination: Secondary | ICD-10-CM | POA: Diagnosis not present

## 2024-06-18 DIAGNOSIS — F17211 Nicotine dependence, cigarettes, in remission: Secondary | ICD-10-CM | POA: Diagnosis not present

## 2024-06-18 DIAGNOSIS — E785 Hyperlipidemia, unspecified: Secondary | ICD-10-CM | POA: Diagnosis not present

## 2024-06-18 DIAGNOSIS — E1169 Type 2 diabetes mellitus with other specified complication: Secondary | ICD-10-CM | POA: Diagnosis not present

## 2024-06-18 DIAGNOSIS — G4700 Insomnia, unspecified: Secondary | ICD-10-CM | POA: Diagnosis not present

## 2024-06-18 DIAGNOSIS — M545 Low back pain, unspecified: Secondary | ICD-10-CM | POA: Diagnosis not present

## 2024-08-03 ENCOUNTER — Other Ambulatory Visit: Payer: Self-pay | Admitting: Gastroenterology

## 2024-09-05 DIAGNOSIS — K219 Gastro-esophageal reflux disease without esophagitis: Secondary | ICD-10-CM | POA: Diagnosis not present

## 2024-09-05 DIAGNOSIS — M159 Polyosteoarthritis, unspecified: Secondary | ICD-10-CM | POA: Diagnosis not present

## 2024-09-05 DIAGNOSIS — M5416 Radiculopathy, lumbar region: Secondary | ICD-10-CM | POA: Diagnosis not present

## 2024-09-05 DIAGNOSIS — Z6834 Body mass index (BMI) 34.0-34.9, adult: Secondary | ICD-10-CM | POA: Diagnosis not present

## 2024-09-05 DIAGNOSIS — E1165 Type 2 diabetes mellitus with hyperglycemia: Secondary | ICD-10-CM | POA: Diagnosis not present

## 2024-09-05 DIAGNOSIS — I10 Essential (primary) hypertension: Secondary | ICD-10-CM | POA: Diagnosis not present

## 2024-09-05 DIAGNOSIS — K5909 Other constipation: Secondary | ICD-10-CM | POA: Diagnosis not present

## 2024-09-05 DIAGNOSIS — E785 Hyperlipidemia, unspecified: Secondary | ICD-10-CM | POA: Diagnosis not present

## 2024-09-05 DIAGNOSIS — F17200 Nicotine dependence, unspecified, uncomplicated: Secondary | ICD-10-CM | POA: Diagnosis not present

## 2024-09-05 DIAGNOSIS — I700 Atherosclerosis of aorta: Secondary | ICD-10-CM | POA: Diagnosis not present

## 2024-09-24 DIAGNOSIS — E7849 Other hyperlipidemia: Secondary | ICD-10-CM | POA: Diagnosis not present

## 2024-09-24 DIAGNOSIS — E669 Obesity, unspecified: Secondary | ICD-10-CM | POA: Diagnosis not present

## 2024-09-24 DIAGNOSIS — E1169 Type 2 diabetes mellitus with other specified complication: Secondary | ICD-10-CM | POA: Diagnosis not present

## 2024-09-24 DIAGNOSIS — Z008 Encounter for other general examination: Secondary | ICD-10-CM | POA: Diagnosis not present

## 2024-09-24 DIAGNOSIS — Z6834 Body mass index (BMI) 34.0-34.9, adult: Secondary | ICD-10-CM | POA: Diagnosis not present

## 2025-05-26 ENCOUNTER — Ambulatory Visit: Admitting: Neurology
